# Patient Record
Sex: Female | Born: 1980 | Race: Black or African American | Hispanic: No | Marital: Single | Smoking: Former smoker
Health system: Southern US, Community
[De-identification: ages and names within clinical notes are randomized; demographics above are authoritative.]

## PROBLEM LIST (undated history)

## (undated) DIAGNOSIS — G809 Cerebral palsy, unspecified: Secondary | ICD-10-CM

## (undated) HISTORY — PX: LEG SURGERY: SHX1003

---

## 2011-11-13 DIAGNOSIS — G809 Cerebral palsy, unspecified: Secondary | ICD-10-CM | POA: Insufficient documentation

## 2012-08-01 DIAGNOSIS — Z113 Encounter for screening for infections with a predominantly sexual mode of transmission: Secondary | ICD-10-CM | POA: Insufficient documentation

## 2012-09-04 ENCOUNTER — Encounter (HOSPITAL_BASED_OUTPATIENT_CLINIC_OR_DEPARTMENT_OTHER): Payer: Self-pay

## 2012-09-04 ENCOUNTER — Emergency Department (HOSPITAL_BASED_OUTPATIENT_CLINIC_OR_DEPARTMENT_OTHER)
Admission: EM | Admit: 2012-09-04 | Discharge: 2012-09-04 | Disposition: A | Discharge status: Home / Self Care | Payer: No Typology Code available for payment source | Attending: Emergency Medicine | Admitting: Emergency Medicine

## 2012-09-04 ENCOUNTER — Emergency Department (HOSPITAL_BASED_OUTPATIENT_CLINIC_OR_DEPARTMENT_OTHER): Payer: No Typology Code available for payment source

## 2012-09-04 DIAGNOSIS — Z87891 Personal history of nicotine dependence: Secondary | ICD-10-CM | POA: Insufficient documentation

## 2012-09-04 DIAGNOSIS — Z8669 Personal history of other diseases of the nervous system and sense organs: Secondary | ICD-10-CM | POA: Insufficient documentation

## 2012-09-04 DIAGNOSIS — O209 Hemorrhage in early pregnancy, unspecified: Secondary | ICD-10-CM | POA: Insufficient documentation

## 2012-09-04 DIAGNOSIS — O9989 Other specified diseases and conditions complicating pregnancy, childbirth and the puerperium: Secondary | ICD-10-CM | POA: Insufficient documentation

## 2012-09-04 DIAGNOSIS — R109 Unspecified abdominal pain: Secondary | ICD-10-CM | POA: Insufficient documentation

## 2012-09-04 DIAGNOSIS — N949 Unspecified condition associated with female genital organs and menstrual cycle: Secondary | ICD-10-CM | POA: Insufficient documentation

## 2012-09-04 HISTORY — DX: Cerebral palsy, unspecified: G80.9

## 2012-09-04 LAB — BASIC METABOLIC PANEL
BUN: 9 mg/dL (ref 6–23)
Chloride: 104 mEq/L (ref 96–112)
GFR calc Af Amer: >90 mL/min (ref >90)
Potassium: 3.6 mEq/L (ref 3.5–5.1)
Sodium: 137 mEq/L (ref 135–145)

## 2012-09-04 LAB — WET PREP, GENITAL
Trich, Wet Prep: NONE SEEN
Yeast Wet Prep HPF POC: NONE SEEN

## 2012-09-04 LAB — URINE MICROSCOPIC-ADD ON

## 2012-09-04 LAB — CBC WITH DIFFERENTIAL/PLATELET
Basophils Absolute: 0 10*3/uL (ref 0.0–0.1)
Basophils Relative: 0 % (ref 0–1)
Hemoglobin: 12.4 g/dL (ref 12.0–15.0)
MCHC: 35.5 g/dL (ref 30.0–36.0)
Monocytes Relative: 9 % (ref 3–12)
Neutro Abs: 4.1 10*3/uL (ref 1.7–7.7)
Neutrophils Relative %: 48 % (ref 43–77)

## 2012-09-04 LAB — URINALYSIS, ROUTINE W REFLEX MICROSCOPIC
Bilirubin Urine: NEGATIVE
Glucose, UA: NEGATIVE mg/dL
Ketones, ur: NEGATIVE mg/dL
pH: 6 (ref 5.0–8.0)

## 2012-09-04 LAB — HCG, QUANTITATIVE, PREGNANCY: hCG, Beta Chain, Quant, S: 4085 m[IU]/mL — ABNORMAL HIGH (ref <5)

## 2012-09-04 MED ORDER — ONDANSETRON HCL 4 MG/2ML IJ SOLN
4.0000 mg | Freq: Once | INTRAMUSCULAR | Status: AC
  Administered 2012-09-04: 4 mg via INTRAVENOUS
  Filled 2012-09-04: qty 2

## 2012-09-04 MED ORDER — MORPHINE SULFATE 4 MG/ML IJ SOLN
4.0000 mg | Freq: Once | INTRAMUSCULAR | Status: AC
  Administered 2012-09-04: 4 mg via INTRAVENOUS
  Filled 2012-09-04: qty 1

## 2012-09-04 MED ORDER — METRONIDAZOLE 500 MG PO TABS: 500.0000 mg | ORAL_TABLET | Freq: Two times a day (BID) | ORAL | Status: DC

## 2012-09-04 MED ORDER — PROMETHAZINE HCL 25 MG PO TABS: 25.0000 mg | ORAL_TABLET | Freq: Four times a day (QID) | ORAL | Status: DC | PRN

## 2012-09-04 MED ORDER — OXYCODONE-ACETAMINOPHEN 5-325 MG PO TABS: 2.0000 | ORAL_TABLET | ORAL | Status: DC | PRN

## 2012-09-04 NOTE — ED Notes (Signed)
Pt reports she is [redacted] weeks gestation and developed vaginal bleeding today.

## 2012-09-04 NOTE — ED Provider Notes (Signed)
History    CSN: [id number] Arrival date & time 09/04/12  1409  First MD Initiated Contact with Patient 09/04/12 1416     Chief Complaint  Patient presents with  . Vaginal Bleeding   (Consider location/radiation/quality/duration/timing/severity/associated sxs/prior Treatment) HPI Comments: Patient is a 32 year old G3 P2 female who presents today with vaginal bleeding. She is [redacted] weeks pregnant. She is followed by [facility] physicians. She has had a positive pregnancy test, hCG Quant, OB ultrasound yesterday. This a gestational sac on the ultrasound, but no fetal heart rate. She has had cramping for the past week. She states the cramping is suprapubic and feels like period cramps. She rates it as an 8/10. This morning she began to bleed heavily. Her blood type is B+. Normal period was June 9. No fevers, chills, nausea, vomiting, abdominal pain, vaginal discharge other than blood, urinary symptoms including urinary frequency, urgency, dysuria.  The history is provided by the patient. No language interpreter was used.   Past Medical History  Diagnosis Date  . Cerebral palsy    Past Surgical History  Procedure Laterality Date  . Leg surgery     No family history on file. History  Substance Use Topics  . Smoking status: Former Games developer  . Smokeless tobacco: Not on file  . Alcohol Use: No   OB History   Grav Para Term Preterm Abortions TAB SAB Ect Mult Living   1              Review of Systems  Constitutional: Negative for fever and chills.  Respiratory: Negative for shortness of breath.   Gastrointestinal: Negative for nausea, vomiting and abdominal pain.  Genitourinary: Positive for vaginal bleeding, menstrual problem and pelvic pain. Negative for dysuria and urgency.  All other systems reviewed and are negative.    Allergies  Vicodin  Home Medications  No current outpatient prescriptions on file. BP 121/56  Pulse 98  Temp(Src) 98.9 F (37.2 C)  (Oral)  Resp 16  Ht 5' (1.524 m)  Wt 112 lb (50.803 kg)  BMI 21.87 kg/m2  SpO2 100% Physical Exam  Nursing note and vitals reviewed. Constitutional: She is oriented to person, place, and time. She appears well-developed and well-nourished. No distress.  HENT:  Head: Normocephalic and atraumatic.  Right Ear: External ear normal.  Left Ear: External ear normal.  Nose: Nose normal.  Mouth/Throat: Oropharynx is clear and moist.  Eyes: Conjunctivae are normal.  Neck: Normal range of motion.  Cardiovascular: Normal rate, regular rhythm and normal heart sounds.   Pulmonary/Chest: Effort normal and breath sounds normal. No stridor. No respiratory distress. She has no wheezes. She has no rales.  Abdominal: Soft. She exhibits no distension. There is tenderness (mild) in the suprapubic area.  Genitourinary: Pelvic exam was performed with patient supine. Cervix exhibits discharge (significant amount of bleeding from os). Cervix exhibits no motion tenderness and no friability. Right adnexum displays no mass, no tenderness and no fullness. Left adnexum displays no mass, no tenderness and no fullness. No erythema or tenderness around the vagina. There is a foreign body (piercing) around the vagina. No signs of injury around the vagina.  Piercing on clitoris   Musculoskeletal: Normal range of motion.  Neurological: She is alert and oriented to person, place, and time. She has normal strength.  Skin: Skin is warm and dry. She is not diaphoretic. No erythema.  Psychiatric: She has a normal mood and affect. Her behavior is normal.    ED  Course  Procedures (including critical care time) Labs Reviewed  WET PREP, GENITAL - Abnormal; Notable for the following:    Clue Cells Wet Prep HPF POC FEW (*)    WBC, Wet Prep HPF POC FEW (*)    All other components within normal limits  CBC WITH DIFFERENTIAL - Abnormal; Notable for the following:    HCT 34.9 (*)    All other components within normal limits  HCG,  QUANTITATIVE, PREGNANCY - Abnormal; Notable for the following:    hCG, Beta Chain, Quant, S 4085 (*)    All other components within normal limits  URINALYSIS, ROUTINE W REFLEX MICROSCOPIC - Abnormal; Notable for the following:    Hgb urine dipstick LARGE (*)    All other components within normal limits  PREGNANCY, URINE - Abnormal; Notable for the following:    Preg Test, Ur POSITIVE (*)    All other components within normal limits  GC/CHLAMYDIA PROBE AMP  BASIC METABOLIC PANEL  URINE MICROSCOPIC-ADD ON   US Ob Comp Less 14 Wks  09/04/2012   *RADIOLOGY REPORT*  Clinical Data: Vaginal bleeding.  OBSTETRIC <14 WK [place] AND TRANSVAGINAL OB US  Technique:  Both transabdominal and transvaginal ultrasound examinations were performed for complete evaluation of the gestation as well as the maternal uterus, adnexal regions, and pelvic cul-de-sac.  Transvaginal technique was performed to assess early pregnancy.  Comparison:  None.  Intrauterine gestational sac:  Single sac visualized in the lower uterine segment Yolk sac: Visualized but appears abnormal Embryo: Not visualized  MSD: 9 mm  5 w 5 d  Maternal uterus/adnexae: A small subchorionic hemorrhage noted.  A small left ovarian corpus luteum also demonstrated.  Ovaries otherwise normal in appearance. No evidence of adnexal mass or free fluid.  IMPRESSION: Single intrauterine gestational sac located in the lower uterine segment with small amount of subchorionic hemorrhage.  Findings are suspicious but not yet definitive for failed pregnancy.  Recommend follow-up US in 7-10 days for definitive diagnosis.  This recommendation follows SRU consensus guidelines: Diagnostic Criteria for Nonviable Pregnancy Early in the First Trimester.  Malva Limes Med 2013; 161:0960-45.   Original Report Authenticated By: Myles Rosenthal, M.D.   US Ob Transvaginal  09/04/2012   *RADIOLOGY REPORT*  Clinical Data: Vaginal bleeding.  OBSTETRIC <14 WK [place] AND TRANSVAGINAL OB US  Technique:   Both transabdominal and transvaginal ultrasound examinations were performed for complete evaluation of the gestation as well as the maternal uterus, adnexal regions, and pelvic cul-de-sac.  Transvaginal technique was performed to assess early pregnancy.  Comparison:  None.  Intrauterine gestational sac:  Single sac visualized in the lower uterine segment Yolk sac: Visualized but appears abnormal Embryo: Not visualized  MSD: 9 mm  5 w 5 d  Maternal uterus/adnexae: A small subchorionic hemorrhage noted.  A small left ovarian corpus luteum also demonstrated.  Ovaries otherwise normal in appearance. No evidence of adnexal mass or free fluid.  IMPRESSION: Single intrauterine gestational sac located in the lower uterine segment with small amount of subchorionic hemorrhage.  Findings are suspicious but not yet definitive for failed pregnancy.  Recommend follow-up US in 7-10 days for definitive diagnosis.  This recommendation follows SRU consensus guidelines: Diagnostic Criteria for Nonviable Pregnancy Early in the First Trimester.  Malva Limes Med 2013; 409:8119-14.   Original Report Authenticated By: Myles Rosenthal, M.D.   1. Vaginal bleeding in pregnancy, first trimester     MDM  Patient presents with vaginal bleeding in her first trimester. HCG  Sharene Butters is 89. OB ultrasound shows findings that are suspicious, but not yet definitive for failed pregnancy. Discussed this with the patient. Followup with your OB on Monday. If she can not get into her OB on Monday, go to [facility]. Discussed warning signs of anemia because she is losing a significant amount of blood vaginally. Return instructions given. Vital signs stable for discharge. Discussed case with Dr. Bernette Mayers who agrees with plan. Patient / Family / Caregiver informed of clinical course, understand medical decision-making process, and agree with plan.   Mora Bellman, PA-C 09/04/12 1801

## 2012-09-06 NOTE — ED Provider Notes (Signed)
Medical screening examination/treatment/procedure(s) were performed by non-physician practitioner and as supervising physician I was immediately available for consultation/collaboration.   Rishika Mccollom B. Ahmadou Bolz, MD 09/06/12 1946 

## 2012-11-17 ENCOUNTER — Emergency Department (HOSPITAL_BASED_OUTPATIENT_CLINIC_OR_DEPARTMENT_OTHER)
Admission: EM | Admit: 2012-11-17 | Discharge: 2012-11-17 | Disposition: A | Discharge status: Home / Self Care | Payer: No Typology Code available for payment source | Attending: Emergency Medicine | Admitting: Emergency Medicine

## 2012-11-17 ENCOUNTER — Encounter (HOSPITAL_BASED_OUTPATIENT_CLINIC_OR_DEPARTMENT_OTHER): Payer: Self-pay | Admitting: *Deleted

## 2012-11-17 DIAGNOSIS — O210 Mild hyperemesis gravidarum: Secondary | ICD-10-CM | POA: Insufficient documentation

## 2012-11-17 DIAGNOSIS — O219 Vomiting of pregnancy, unspecified: Secondary | ICD-10-CM

## 2012-11-17 DIAGNOSIS — Z79899 Other long term (current) drug therapy: Secondary | ICD-10-CM | POA: Insufficient documentation

## 2012-11-17 DIAGNOSIS — R5381 Other malaise: Secondary | ICD-10-CM | POA: Insufficient documentation

## 2012-11-17 DIAGNOSIS — R51 Headache: Secondary | ICD-10-CM | POA: Insufficient documentation

## 2012-11-17 DIAGNOSIS — Z87891 Personal history of nicotine dependence: Secondary | ICD-10-CM | POA: Insufficient documentation

## 2012-11-17 DIAGNOSIS — N9489 Other specified conditions associated with female genital organs and menstrual cycle: Secondary | ICD-10-CM | POA: Insufficient documentation

## 2012-11-17 DIAGNOSIS — Z8669 Personal history of other diseases of the nervous system and sense organs: Secondary | ICD-10-CM | POA: Insufficient documentation

## 2012-11-17 DIAGNOSIS — O9989 Other specified diseases and conditions complicating pregnancy, childbirth and the puerperium: Secondary | ICD-10-CM | POA: Insufficient documentation

## 2012-11-17 LAB — PREGNANCY, URINE: Preg Test, Ur: POSITIVE — AB

## 2012-11-17 LAB — URINALYSIS, ROUTINE W REFLEX MICROSCOPIC
Bilirubin Urine: NEGATIVE
Nitrite: NEGATIVE
Protein, ur: NEGATIVE mg/dL
Specific Gravity, Urine: 1.008 (ref 1.005–1.030)
Urobilinogen, UA: 1 mg/dL (ref 0.0–1.0)

## 2012-11-17 MED ORDER — ONDANSETRON HCL 4 MG/2ML IJ SOLN: 4.0000 mg | Freq: Once | INTRAMUSCULAR | Status: DC

## 2012-11-17 MED ORDER — ONDANSETRON 8 MG PO TBDP: 8.0000 mg | ORAL_TABLET | Freq: Three times a day (TID) | ORAL | Status: DC | PRN

## 2012-11-17 MED ORDER — ONDANSETRON 4 MG PO TBDP
4.0000 mg | ORAL_TABLET | Freq: Once | ORAL | Status: AC
  Administered 2012-11-17: 4 mg via ORAL

## 2012-11-17 MED ORDER — PRENATABS FA PO TABS: 1.0000 | ORAL_TABLET | Freq: Every day | ORAL | Status: DC

## 2012-11-17 MED ORDER — ONDANSETRON 4 MG PO TBDP
ORAL_TABLET | ORAL | Status: AC
  Filled 2012-11-17: qty 1

## 2012-11-17 NOTE — ED Notes (Signed)
MD at bedside. 

## 2012-11-17 NOTE — ED Provider Notes (Signed)
CSN: [id number]     Arrival date & time 11/17/12  0049 History   None    Chief Complaint  Patient presents with  . Vomiting    (Consider location/radiation/quality/duration/timing/severity/associated sxs/prior Treatment) HPI This is a 32 year old female who had a miscarriage earlier this year. Her last menstrual period was 10/17/2012. She is here with a three-day history of general malaise, nausea and headache. She vomited one time before coming to the ED. She continues to be nauseated and is having some mild uterine cramping. She denies fever, chills, diarrhea, dysuria, hematuria, vaginal bleeding or vaginal discharge.  Past Medical History  Diagnosis Date  . Cerebral palsy    Past Surgical History  Procedure Laterality Date  . Leg surgery     History reviewed. No pertinent family history. History  Substance Use Topics  . Smoking status: Former Games developer  . Smokeless tobacco: Not on file  . Alcohol Use: No   OB History   Grav Para Term Preterm Abortions TAB SAB Ect Mult Living   1              Review of Systems  All other systems reviewed and are negative.    Allergies  Vicodin  Home Medications   Current Outpatient Rx  Name  Route  Sig  Dispense  Refill  . metroNIDAZOLE (FLAGYL) 500 MG tablet   Oral   Take 1 tablet (500 mg total) by mouth 2 (two) times daily. One po bid x 7 days   14 tablet   0   . oxyCODONE-acetaminophen (PERCOCET/ROXICET) 5-325 MG per tablet   Oral   Take 2 tablets by mouth every 4 (four) hours as needed for pain.   6 tablet   0   . promethazine (PHENERGAN) 25 MG tablet   Oral   Take 1 tablet (25 mg total) by mouth every 6 (six) hours as needed for nausea.   12 tablet   0    BP 126/66  Pulse 100  Temp(Src) 98.2 F (36.8 C) (Oral)  Resp 16  Ht 5' (1.524 m)  Wt 118 lb (53.524 kg)  BMI 23.05 kg/m2  SpO2 100%  LMP 10/17/2012  Breastfeeding? Unknown  Physical Exam General: Well-developed, well-nourished female in no acute  distress; appearance consistent with age of record HENT: normocephalic; atraumatic Eyes: pupils equal, round and reactive to light; extraocular muscles intact Neck: supple Heart: regular rate and rhythm Lungs: clear to auscultation bilaterally Abdomen: soft; nondistended; nontender; no masses or hepatosplenomegaly; bowel sounds present GU: No CVA tenderness Extremities: No deformity; full range of motion; pulses normal Neurologic: Awake, alert and oriented; motor function intact in all extremities and symmetric; no facial droop Skin: Warm and dry Psychiatric: Normal mood and affect    ED Course  Procedures (including critical care time)   MDM   Nursing notes and vitals signs, including pulse oximetry, reviewed.  Summary of this visit's results, reviewed by myself:  Labs:  Results for orders placed during the hospital encounter of 11/17/12 (from the past 24 hour(s))  URINALYSIS, ROUTINE W REFLEX MICROSCOPIC     Status: Abnormal   Collection Time    11/17/12 12:58 AM      Result Value Range   Color, Urine YELLOW  YELLOW   APPearance CLEAR  CLEAR   Specific Gravity, Urine 1.008  1.005 - 1.030   pH 6.0  5.0 - 8.0   Glucose, UA NEGATIVE  NEGATIVE mg/dL   Hgb urine dipstick NEGATIVE  NEGATIVE   Bilirubin  Urine NEGATIVE  NEGATIVE   Ketones, ur 15 (*) NEGATIVE mg/dL   Protein, ur NEGATIVE  NEGATIVE mg/dL   Urobilinogen, UA 1.0  0.0 - 1.0 mg/dL   Nitrite NEGATIVE  NEGATIVE   Leukocytes, UA NEGATIVE  NEGATIVE  PREGNANCY, URINE     Status: Abnormal   Collection Time    11/17/12 12:58 AM      Result Value Range   Preg Test, Ur POSITIVE (*) NEGATIVE   1:33 AM Vomiting is likely pregnancy related. We will treat her with Zofran and refer her to [facility].     Hanley Seamen, MD 11/17/12 [phone]

## 2012-11-17 NOTE — ED Notes (Signed)
Pt vomited x 1 tonight, denies fever or chills

## 2012-12-07 ENCOUNTER — Emergency Department (HOSPITAL_BASED_OUTPATIENT_CLINIC_OR_DEPARTMENT_OTHER)
Admission: EM | Admit: 2012-12-07 | Discharge: 2012-12-08 | Disposition: A | Discharge status: Home / Self Care | Payer: No Typology Code available for payment source | Attending: Emergency Medicine | Admitting: Emergency Medicine

## 2012-12-07 ENCOUNTER — Encounter (HOSPITAL_BASED_OUTPATIENT_CLINIC_OR_DEPARTMENT_OTHER): Payer: Self-pay | Admitting: Emergency Medicine

## 2012-12-07 ENCOUNTER — Emergency Department (HOSPITAL_BASED_OUTPATIENT_CLINIC_OR_DEPARTMENT_OTHER): Payer: No Typology Code available for payment source

## 2012-12-07 DIAGNOSIS — O9989 Other specified diseases and conditions complicating pregnancy, childbirth and the puerperium: Secondary | ICD-10-CM | POA: Insufficient documentation

## 2012-12-07 DIAGNOSIS — K5900 Constipation, unspecified: Secondary | ICD-10-CM | POA: Insufficient documentation

## 2012-12-07 DIAGNOSIS — Z79899 Other long term (current) drug therapy: Secondary | ICD-10-CM | POA: Insufficient documentation

## 2012-12-07 DIAGNOSIS — Z8669 Personal history of other diseases of the nervous system and sense organs: Secondary | ICD-10-CM | POA: Insufficient documentation

## 2012-12-07 DIAGNOSIS — O219 Vomiting of pregnancy, unspecified: Secondary | ICD-10-CM

## 2012-12-07 DIAGNOSIS — R05 Cough: Secondary | ICD-10-CM | POA: Insufficient documentation

## 2012-12-07 DIAGNOSIS — O26899 Other specified pregnancy related conditions, unspecified trimester: Secondary | ICD-10-CM

## 2012-12-07 DIAGNOSIS — Z3490 Encounter for supervision of normal pregnancy, unspecified, unspecified trimester: Secondary | ICD-10-CM

## 2012-12-07 DIAGNOSIS — N949 Unspecified condition associated with female genital organs and menstrual cycle: Secondary | ICD-10-CM | POA: Insufficient documentation

## 2012-12-07 DIAGNOSIS — R5381 Other malaise: Secondary | ICD-10-CM | POA: Insufficient documentation

## 2012-12-07 DIAGNOSIS — R059 Cough, unspecified: Secondary | ICD-10-CM | POA: Insufficient documentation

## 2012-12-07 DIAGNOSIS — Z87891 Personal history of nicotine dependence: Secondary | ICD-10-CM | POA: Insufficient documentation

## 2012-12-07 DIAGNOSIS — N898 Other specified noninflammatory disorders of vagina: Secondary | ICD-10-CM | POA: Insufficient documentation

## 2012-12-07 DIAGNOSIS — O210 Mild hyperemesis gravidarum: Secondary | ICD-10-CM | POA: Insufficient documentation

## 2012-12-07 DIAGNOSIS — R6883 Chills (without fever): Secondary | ICD-10-CM | POA: Insufficient documentation

## 2012-12-07 LAB — URINALYSIS, ROUTINE W REFLEX MICROSCOPIC
Bilirubin Urine: NEGATIVE
Glucose, UA: NEGATIVE mg/dL
Ketones, ur: NEGATIVE mg/dL
Leukocytes, UA: NEGATIVE
Protein, ur: NEGATIVE mg/dL

## 2012-12-07 LAB — COMPREHENSIVE METABOLIC PANEL
Albumin: 3.5 g/dL (ref 3.5–5.2)
Alkaline Phosphatase: 43 U/L (ref 39–117)
BUN: 8 mg/dL (ref 6–23)
Chloride: 102 mEq/L (ref 96–112)
Creatinine, Ser: 0.7 mg/dL (ref 0.50–1.10)
GFR calc Af Amer: >90 mL/min (ref >90)
GFR calc non Af Amer: >90 mL/min (ref >90)
Glucose, Bld: 91 mg/dL (ref 70–99)
Potassium: 3.7 mEq/L (ref 3.5–5.1)
Total Bilirubin: 0.2 mg/dL — ABNORMAL LOW (ref 0.3–1.2)

## 2012-12-07 LAB — CBC
MCV: 89.7 fL (ref 78.0–100.0)
Platelets: 283 10*3/uL (ref 150–400)
RBC: 3.59 MIL/uL — ABNORMAL LOW (ref 3.87–5.11)
RDW: 12.6 % (ref 11.5–15.5)
WBC: 7.9 10*3/uL (ref 4.0–10.5)

## 2012-12-07 LAB — WET PREP, GENITAL
Trich, Wet Prep: NONE SEEN
Yeast Wet Prep HPF POC: NONE SEEN

## 2012-12-07 MED ORDER — SODIUM CHLORIDE 0.9 % IV BOLUS (SEPSIS)
1000.0000 mL | INTRAVENOUS | Status: AC
  Administered 2012-12-07: 1000 mL via INTRAVENOUS

## 2012-12-07 MED ORDER — ONDANSETRON HCL 4 MG/2ML IJ SOLN
4.0000 mg | Freq: Once | INTRAMUSCULAR | Status: AC
  Administered 2012-12-07: 4 mg via INTRAVENOUS
  Filled 2012-12-07: qty 2

## 2012-12-07 NOTE — ED Notes (Signed)
Provider at bedside

## 2012-12-07 NOTE — ED Notes (Signed)
Vomiting x 3 days. She is [redacted] weeks pregnant. Chills.

## 2012-12-07 NOTE — ED Notes (Signed)
Patient transported to ultrasound via stretcher per tech. 

## 2012-12-07 NOTE — ED Provider Notes (Signed)
CSN: [id number]     Arrival date & time 12/07/12  2019 History  This chart was scribed for Coral Ceo, PA, working with Shanna Cisco, MD by Blanchard Kelch, ED Scribe. This patient was seen in room MH10/MH10 and the patient's care was started at 10:15 PM.      Chief Complaint  Patient presents with  . Emesis During Pregnancy    The history is provided by the patient. No language interpreter was used.    HPI Comments: Normagene Harvie is a 32 y.o. female with a PMH of cerebral palsy who presents to the Emergency Department complaining of nausea and vomiting that began three days ago. She vomited four times today and her last episode of emesis was about 7 pm. She states the material she vomits is whatever she ate. No hematemesis.  She is currently eight weeks pregnant and has been having similar episodes of vomiting throughout the pregnancy this far.  Patient also has had abdominal pain for the past two days.  Abdominal pain is intermittent, sharp and located in the lower middle abdominal without radiation.  Nothing makes her pain better or worse.  She has not done anything to treat her symptoms.  She has an appointment on 12/17/12 to establish care for her pregnancy.  This is her fourth pregnancy. She also reports having white, chalky vaginal discharge.  No vaginal bleeding, sores or pain.  She also complains of associated baseline non-productive cough, constipation with last BM two days ago, chills, generalized weakness.  She denies fever, SOB, chest pain, diarrhea, dysuria, hematuria, or difficulty urinating.  Her last normal menstrual period was 10/09/12.    Past Medical History  Diagnosis Date  . Cerebral palsy    Past Surgical History  Procedure Laterality Date  . Leg surgery     No family history on file. History  Substance Use Topics  . Smoking status: Former Games developer  . Smokeless tobacco: Not on file  . Alcohol Use: No   OB History   Grav Para Term Preterm Abortions TAB SAB  Ect Mult Living   2              Review of Systems  Constitutional: Positive for chills. Negative for fever, diaphoresis, activity change, appetite change and fatigue.  HENT: Negative for congestion, mouth sores and rhinorrhea.   Eyes: Negative for photophobia and visual disturbance.  Respiratory: Negative for cough and shortness of breath.   Cardiovascular: Negative for chest pain and leg swelling.  Gastrointestinal: Positive for nausea, vomiting, abdominal pain and constipation. Negative for diarrhea, blood in stool, anal bleeding and rectal pain.  Genitourinary: Positive for vaginal discharge and pelvic pain. Negative for dysuria, hematuria, vaginal bleeding, difficulty urinating and vaginal pain.  Musculoskeletal: Negative for back pain, gait problem, joint swelling, myalgias, neck pain and neck stiffness.  Skin: Negative for color change and wound.  Neurological: Positive for weakness (generalized). Negative for dizziness, syncope, light-headedness, numbness and headaches.  Psychiatric/Behavioral: Negative for confusion.  All other systems reviewed and are negative.    Allergies  Vicodin  Home Medications   Current Outpatient Rx  Name  Route  Sig  Dispense  Refill  . metroNIDAZOLE (FLAGYL) 500 MG tablet   Oral   Take 1 tablet (500 mg total) by mouth 2 (two) times daily. One po bid x 7 days   14 tablet   0   . ondansetron (ZOFRAN ODT) 8 MG disintegrating tablet   Oral   Take 1 tablet (8  mg total) by mouth every 8 (eight) hours as needed for nausea.   10 tablet   0   . oxyCODONE-acetaminophen (PERCOCET/ROXICET) 5-325 MG per tablet   Oral   Take 2 tablets by mouth every 4 (four) hours as needed for pain.   6 tablet   0   . Prenatal Vit-Fe Fumarate-FA (PRENATABS FA) TABS   Oral   Take 1 tablet by mouth daily.   30 each   0   . promethazine (PHENERGAN) 25 MG tablet   Oral   Take 1 tablet (25 mg total) by mouth every 6 (six) hours as needed for nausea.   12  tablet   0    Triage Vitals: BP 114/47  Pulse 74  Temp(Src) 97.4 F (36.3 C) (Oral)  Resp 18  Wt 118 lb (53.524 kg)  BMI 23.05 kg/m2  SpO2 100%  LMP 10/09/2012  Filed Vitals:   12/07/12 2028 12/08/12 0027  BP: 114/47 116/54  Pulse: 74 80  Temp: 97.4 F (36.3 C) 98.5 F (36.9 C)  TempSrc: Oral Oral  Resp: 18 16  Weight: 118 lb (53.524 kg)   SpO2: 100% 99%    Physical Exam  Nursing note and vitals reviewed. Constitutional: She is oriented to person, place, and time. She appears well-developed and well-nourished. No distress.  HENT:  Head: Normocephalic and atraumatic.  Right Ear: External ear normal.  Left Ear: External ear normal.  Nose: Nose normal.  Mouth/Throat: Oropharynx is clear and moist. No oropharyngeal exudate.  Eyes: Conjunctivae and EOM are normal. Right eye exhibits no discharge. Left eye exhibits no discharge.  Neck: Neck supple. No tracheal deviation present.  Cardiovascular: Normal rate, regular rhythm, normal heart sounds and intact distal pulses.  Exam reveals no gallop and no friction rub.   No murmur heard. Dorsalis pedis pulses present and equal bilaterally  Pulmonary/Chest: Effort normal and breath sounds normal. No respiratory distress. She has no wheezes. She has no rales. She exhibits no tenderness.  Abdominal: Soft. Bowel sounds are normal. She exhibits no distension and no mass. There is tenderness. There is no rebound and no guarding.  Mild middle lower abdominal tenderness to palpation.  No RLQ or LLQ tenderness.    Musculoskeletal: Normal range of motion. She exhibits no edema and no tenderness.  No CVA or lumbar tenderness bilaterally.   Neurological: She is alert and oriented to person, place, and time.  Skin: Skin is warm and dry. She is not diaphoretic.  Psychiatric: She has a normal mood and affect. Her behavior is normal.    ED Course  Procedures (including critical care time)  DIAGNOSTIC STUDIES: Oxygen Saturation is 100% on  room air, normal by my interpretation.    COORDINATION OF CARE: 10:21 PM -Will order IV fluids, Zofran injection, OB Ultrasound, CMP, CBC, Urinalysis and Pregnancy test. Patient verbalizes understanding and agrees with treatment plan.    Labs Review Labs Reviewed  WET PREP, GENITAL - Abnormal; Notable for the following:    Clue Cells Wet Prep HPF POC FEW (*)    WBC, Wet Prep HPF POC FEW (*)    All other components within normal limits  URINALYSIS, ROUTINE W REFLEX MICROSCOPIC - Abnormal; Notable for the following:    APPearance CLOUDY (*)    All other components within normal limits  PREGNANCY, URINE - Abnormal; Notable for the following:    Preg Test, Ur POSITIVE (*)    All other components within normal limits  COMPREHENSIVE METABOLIC PANEL - Abnormal;  Notable for the following:    Total Bilirubin 0.2 (*)    All other components within normal limits  CBC - Abnormal; Notable for the following:    RBC 3.59 (*)    Hemoglobin 11.3 (*)    HCT 32.2 (*)    All other components within normal limits  GC/CHLAMYDIA PROBE AMP   Imaging Review US Ob Comp Less 14 Wks  12/08/2012   CLINICAL DATA:  Abdominal pain and pregnancy  EXAM: OBSTETRIC <14 WK ULTRASOUND  TECHNIQUE: Transabdominal ultrasound was performed for evaluation of the gestation as well as the maternal uterus and adnexal regions.  COMPARISON:  09/04/2012  FINDINGS: Intrauterine gestational sac: Visualized/normal in shape.  Yolk sac:  present  Embryo:  Present  Cardiac Activity: Present  Heart Rate: 158 bpm  CRL:   17  mm   8 w 1 d                  [place] EDC: 07/18/2013  Maternal uterus/adnexae: The uterus is unremarkable other than above described gestation. Right ovary measures 4.2 x 2.8 x 3.8 cm, larger than the left secondary to a dominant follicle or cyst, 2.8 cm in maximal dimension. The left ovary measures 2.7 x 2.8 x 2.7 cm.  IMPRESSION: Single living intrauterine gestation, estimated gestational age [redacted] weeks 1 day.    Electronically Signed   By: Tiburcio Pea M.D.   On: 12/08/2012 00:15    EKG Interpretation   None      Results for orders placed during the hospital encounter of 12/07/12  WET PREP, GENITAL      Result Value Range   Yeast Wet Prep HPF POC NONE SEEN  NONE SEEN   Trich, Wet Prep NONE SEEN  NONE SEEN   Clue Cells Wet Prep HPF POC FEW (*) NONE SEEN   WBC, Wet Prep HPF POC FEW (*) NONE SEEN  GC/CHLAMYDIA PROBE AMP      Result Value Range   CT Probe RNA NEGATIVE  NEGATIVE   GC Probe RNA NEGATIVE  NEGATIVE  URINALYSIS, ROUTINE W REFLEX MICROSCOPIC      Result Value Range   Color, Urine YELLOW  YELLOW   APPearance CLOUDY (*) CLEAR   Specific Gravity, Urine 1.018  1.005 - 1.030   pH 6.0  5.0 - 8.0   Glucose, UA NEGATIVE  NEGATIVE mg/dL   Hgb urine dipstick NEGATIVE  NEGATIVE   Bilirubin Urine NEGATIVE  NEGATIVE   Ketones, ur NEGATIVE  NEGATIVE mg/dL   Protein, ur NEGATIVE  NEGATIVE mg/dL   Urobilinogen, UA 0.2  0.0 - 1.0 mg/dL   Nitrite NEGATIVE  NEGATIVE   Leukocytes, UA NEGATIVE  NEGATIVE  PREGNANCY, URINE      Result Value Range   Preg Test, Ur POSITIVE (*) NEGATIVE  COMPREHENSIVE METABOLIC PANEL      Result Value Range   Sodium 135  135 - 145 mEq/L   Potassium 3.7  3.5 - 5.1 mEq/L   Chloride 102  96 - 112 mEq/L   CO2 22  19 - 32 mEq/L   Glucose, Bld 91  70 - 99 mg/dL   BUN 8  6 - 23 mg/dL   Creatinine, Ser 1.19  0.50 - 1.10 mg/dL   Calcium 9.0  8.4 - 14.7 mg/dL   Total Protein 6.6  6.0 - 8.3 g/dL   Albumin 3.5  3.5 - 5.2 g/dL   AST 18  0 - 37 U/L   ALT 12  0 -  35 U/L   Alkaline Phosphatase 43  39 - 117 U/L   Total Bilirubin 0.2 (*) 0.3 - 1.2 mg/dL   GFR calc non Af Amer >90  >90 mL/min   GFR calc Af Amer >90  >90 mL/min  CBC      Result Value Range   WBC 7.9  4.0 - 10.5 K/uL   RBC 3.59 (*) 3.87 - 5.11 MIL/uL   Hemoglobin 11.3 (*) 12.0 - 15.0 g/dL   HCT 30.8 (*) 65.7 - 84.6 %   MCV 89.7  78.0 - 100.0 fL   MCH 31.5  26.0 - 34.0 pg   MCHC 35.1  30.0 - 36.0  g/dL   RDW 96.2  95.2 - 84.1 %   Platelets 283  150 - 400 K/uL   US OB Comp Less 14 Wks (Final result)  Result time: 12/08/12 00:15:31    Final result by Rad Results In Interface (12/08/12 00:15:31)    Narrative:   CLINICAL DATA: Abdominal pain and pregnancy  EXAM: OBSTETRIC <14 WK ULTRASOUND  TECHNIQUE: Transabdominal ultrasound was performed for evaluation of the gestation as well as the maternal uterus and adnexal regions.  COMPARISON: 09/04/2012  FINDINGS: Intrauterine gestational sac: Visualized/normal in shape.  Yolk sac: present  Embryo: Present  Cardiac Activity: Present  Heart Rate: 158 bpm  CRL: 17 mm 8 w 1 d [place] EDC: 07/18/2013  Maternal uterus/adnexae: The uterus is unremarkable other than above described gestation. Right ovary measures 4.2 x 2.8 x 3.8 cm, larger than the left secondary to a dominant follicle or cyst, 2.8 cm in maximal dimension. The left ovary measures 2.7 x 2.8 x 2.7 cm.  IMPRESSION: Single living intrauterine gestation, estimated gestational age [redacted] weeks 1 day.   Electronically Signed By: Tiburcio Pea M.D. On: 12/08/2012 00:15         MDM   1. Pelvic pain in pregnancy   2. Intrauterine pregnancy   3. Nausea and vomiting in pregnancy     Samantha Sutton is a 33 y.o. female with a PMH of cerebral palsy who presents to the Emergency Department complaining of nausea and vomiting that began three days ago.  1L fluids ordered.  Zofran given for nausea. Pelvic US ordered to evaluate for ectopic.  CBC, CMP, UA, urine pregnancy ordered.  Will perform pelvic exam.     Rechecks  11:50 PM = Pelvic exam performed at bedside with nurse present. Minimal amount of thin white discharge present in the vaginal canal. No vaginal bleeding. No cervical motion tenderness. No adnexal tenderness bilaterally. 12:50 AM = Patient states she feels much better.  Abdominal pain resolved.  States "it was really more of a discomfort."  Tolerated PO  challenge. No emesis.  Repeat abdominal exam benign.     Patient was evaluated in the ED for nausea and emesis, which is likely due to hyperemesis gravidarum or normal pregnancy emesis. She had no episodes of emesis throughout her ED visit.  Her electrolytes were WNL and there were no significant signs of dehydration on UA.  She was prescribed Zofran for further evaluation and management.  Patient complained of middle lower pelvic pain on ROS. She had confirmation of an intrauterine gestation on ultrasound.  Patient's pain resolved throughout her emergency room visit. Her abdominal exam was benign.  She had a few clue cells, however, will not be treated at this time.  She was recently treated with Flagyl.  GC probe sent for analysis.  Urinalysis not suggestive of a urinary  tract infection at this time.  Patient was instructed to followup at her scheduled appointment with OB/GYN. Patient was instructed to return to the ED if they experience any signs of dehydration, fever, repeated vomiting, vaginal bleeding, change or worsening abdominal pain, or other concerns.  Patient was in agreement with discharge and plan.  Sister is present in the emergency room who is also a patient of mine.    Final impressions: 1. Pelvic pain in pregnancy  2. Intrauterine pregnancy  3. Nausea and vomiting in pregnancy     Luiz Iron PA-C   I personally performed the services described in this documentation, which was scribed in my presence. The recorded information has been reviewed and is accurate.     Jillyn Ledger, PA-C 12/10/12 [phone]

## 2012-12-08 MED ORDER — ONDANSETRON HCL 4 MG PO TABS: 4.0000 mg | ORAL_TABLET | Freq: Three times a day (TID) | ORAL | Status: DC | PRN

## 2012-12-08 NOTE — ED Notes (Signed)
Provider at bedside to discuss results of testing.  Pt tolerating po challenge without emesis.

## 2012-12-14 NOTE — ED Provider Notes (Signed)
Medical screening examination/treatment/procedure(s) were performed by non-physician practitioner and as supervising physician I was immediately available for consultation/collaboration.   Shanna Cisco, MD 12/14/12 [phone]

## 2013-01-04 DIAGNOSIS — R011 Cardiac murmur, unspecified: Secondary | ICD-10-CM | POA: Insufficient documentation

## 2013-10-04 ENCOUNTER — Ambulatory Visit (INDEPENDENT_AMBULATORY_CARE_PROVIDER_SITE_OTHER): Payer: Medicare (Managed Care) | Admitting: Family Medicine

## 2013-10-04 VITALS — BP 120/80 | HR 52 | Temp 97.9°F | Resp 16 | Ht 67.5 in | Wt 103.0 lb

## 2013-10-04 DIAGNOSIS — Z111 Encounter for screening for respiratory tuberculosis: Secondary | ICD-10-CM

## 2013-10-04 NOTE — Patient Instructions (Signed)
Good luck with your new job- thanks for waiting so patiently today.  Take care!

## 2013-10-04 NOTE — Progress Notes (Signed)
[facility] [address], [place] [id number] [phone]  Date:  10/04/2013   Name:  Samantha MendsLathia Ramos   DOB:  1980-10-23   MRN:  [id number]  PCP:  No PCP Per Patient    Chief Complaint: PPD Placement   History of Present Illness:  Samantha MendsLathia Lannan is a [age] y.o. 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  She has never tested positive- has had negiatvie PPD in the past.  She has a history of CP and is otherwise generally healthy.  She delivered a baby girl in May and has not yet returned to menstruation - her baby is doing well  There are no active problems to display for this patient.   Past Medical History  Diagnosis Date  . Cerebral palsy     Past Surgical History  Procedure Laterality Date  . Leg surgery      History  Substance Use Topics  . Smoking status: Former Games developermoker  . Smokeless tobacco: Not on file  . Alcohol Use: No    Family History  Problem Relation Age of Onset  . Diabetes Mother   . High Cholesterol Mother   . Hypertension Mother   . Diabetes Maternal Grandmother   . Hypertension Maternal Grandmother   . High Cholesterol Maternal Grandmother   . Diabetes Paternal Grandmother   . High Cholesterol Paternal Grandmother     Allergies  Allergen Reactions  . Vicodin [Hydrocodone-Acetaminophen] Itching    Medication list has been reviewed and updated.  Current Outpatient Prescriptions on File Prior to Visit  Medication Sig Dispense Refill  . ondansetron (ZOFRAN) 4 MG tablet Take 1 tablet (4 mg total) by mouth every 8 (eight) hours as needed for nausea.  10 tablet  0  . oxyCODONE-acetaminophen (PERCOCET/ROXICET) 5-325 MG per tablet Take 2 tablets by mouth every 4 (four) hours as needed for pain.  6 tablet  0   No current facility-administered medications on file prior to visit.    Review of Systems:  As per HPI- otherwise  negative.   Physical Examination: Filed Vitals:   10/04/13 1223  BP: 120/80  Pulse: 52  Temp: 97.9 F (36.6 C)  Resp: 16   Filed Vitals:   10/04/13 1223  Height: 5' 7.5" (1.715 m)  Weight: 103 lb (46.72 kg)   Body mass index is 15.88 kg/(m^2). Ideal Body Weight: Weight in (lb) to have BMI = 25: 161.7  GEN: WDWN, NAD, Non-toxic, A & O x 3, slim build HEENT: Atraumatic, Normocephalic. Neck supple. No masses, No LAD. Ears and Nose: No external deformity. CV: RRR, No M/G/R. No JVD. No thrill. No extra heart sounds. PULM: CTA B, no wheezes, crackles, rhonchi. No retractions. No resp. distress. No accessory muscle use. EXTR: No c/c/e NEURO Normal gait for pt- she has CP and spasticity of her muscles PSYCH: Normally interactive. Conversant. Not depressed or anxious appearing.  Calm demeanor.    Assessment and Plan: Screening-pulmonary TB - Plan: TB Skin Test  Placed PPD today- she will return for read  Signed Abbe AmsterdamJessica Copland, MD

## 2013-10-06 ENCOUNTER — Ambulatory Visit (INDEPENDENT_AMBULATORY_CARE_PROVIDER_SITE_OTHER): Payer: Medicare (Managed Care) | Admitting: *Deleted

## 2013-10-06 DIAGNOSIS — Z111 Encounter for screening for respiratory tuberculosis: Secondary | ICD-10-CM

## 2013-10-06 DIAGNOSIS — Z09 Encounter for follow-up examination after completed treatment for conditions other than malignant neoplasm: Secondary | ICD-10-CM

## 2013-10-06 LAB — TB SKIN TEST
Induration: 0 mm
TB SKIN TEST: NEGATIVE

## 2014-06-28 ENCOUNTER — Encounter (HOSPITAL_BASED_OUTPATIENT_CLINIC_OR_DEPARTMENT_OTHER): Payer: Self-pay

## 2014-06-28 ENCOUNTER — Emergency Department (HOSPITAL_BASED_OUTPATIENT_CLINIC_OR_DEPARTMENT_OTHER): Payer: Medicare Other

## 2014-06-28 ENCOUNTER — Emergency Department (HOSPITAL_BASED_OUTPATIENT_CLINIC_OR_DEPARTMENT_OTHER)
Admission: EM | Admit: 2014-06-28 | Discharge: 2014-06-28 | Disposition: A | Discharge status: Home / Self Care | Payer: Medicare Other | Attending: Emergency Medicine | Admitting: Emergency Medicine

## 2014-06-28 DIAGNOSIS — S79911A Unspecified injury of right hip, initial encounter: Secondary | ICD-10-CM | POA: Insufficient documentation

## 2014-06-28 DIAGNOSIS — Y998 Other external cause status: Secondary | ICD-10-CM | POA: Insufficient documentation

## 2014-06-28 DIAGNOSIS — S8991XA Unspecified injury of right lower leg, initial encounter: Secondary | ICD-10-CM | POA: Insufficient documentation

## 2014-06-28 DIAGNOSIS — Y9389 Activity, other specified: Secondary | ICD-10-CM | POA: Insufficient documentation

## 2014-06-28 DIAGNOSIS — T07XXXA Unspecified multiple injuries, initial encounter: Secondary | ICD-10-CM

## 2014-06-28 DIAGNOSIS — Z8669 Personal history of other diseases of the nervous system and sense organs: Secondary | ICD-10-CM | POA: Diagnosis not present

## 2014-06-28 DIAGNOSIS — Z3202 Encounter for pregnancy test, result negative: Secondary | ICD-10-CM | POA: Insufficient documentation

## 2014-06-28 DIAGNOSIS — S8992XA Unspecified injury of left lower leg, initial encounter: Secondary | ICD-10-CM | POA: Insufficient documentation

## 2014-06-28 DIAGNOSIS — S5011XA Contusion of right forearm, initial encounter: Secondary | ICD-10-CM | POA: Diagnosis not present

## 2014-06-28 DIAGNOSIS — Y92410 Unspecified street and highway as the place of occurrence of the external cause: Secondary | ICD-10-CM | POA: Insufficient documentation

## 2014-06-28 DIAGNOSIS — Z87891 Personal history of nicotine dependence: Secondary | ICD-10-CM | POA: Diagnosis not present

## 2014-06-28 LAB — PREGNANCY, URINE: PREG TEST UR: NEGATIVE

## 2014-06-28 MED ORDER — IBUPROFEN 800 MG PO TABS
800.0000 mg | ORAL_TABLET | Freq: Once | ORAL | Status: AC
  Administered 2014-06-28: 800 mg via ORAL
  Filled 2014-06-28: qty 1

## 2014-06-28 MED ORDER — TRAMADOL HCL 50 MG PO TABS: 50.0000 mg | ORAL_TABLET | Freq: Four times a day (QID) | ORAL | Status: DC | PRN

## 2014-06-28 MED ORDER — IBUPROFEN 600 MG PO TABS: 600.0000 mg | ORAL_TABLET | Freq: Four times a day (QID) | ORAL | Status: DC | PRN

## 2014-06-28 NOTE — Discharge Instructions (Signed)
Contusion °A contusion is a deep bruise. Contusions are the result of an injury that caused bleeding under the skin. The contusion may turn blue, purple, or yellow. Minor injuries will give you a painless contusion, but more severe contusions may stay painful and swollen for a few weeks.  °CAUSES  °A contusion is usually caused by a blow, trauma, or direct force to an area of the body. °SYMPTOMS  °· Swelling and redness of the injured area. °· Bruising of the injured area. °· Tenderness and soreness of the injured area. °· Pain. °DIAGNOSIS  °The diagnosis can be made by taking a history and physical exam. An X-ray, CT scan, or MRI may be needed to determine if there were any associated injuries, such as fractures. °TREATMENT  °Specific treatment will depend on what area of the body was injured. In general, the best treatment for a contusion is resting, icing, elevating, and applying cold compresses to the injured area. Over-the-counter medicines may also be recommended for pain control. Ask your caregiver what the best treatment is for your contusion. °HOME CARE INSTRUCTIONS  °· Put ice on the injured area. °¨ Put ice in a plastic bag. °¨ Place a towel between your skin and the bag. °¨ Leave the ice on for 15-20 minutes, 3-4 times a day, or as directed by your health care provider. °· Only take over-the-counter or prescription medicines for pain, discomfort, or fever as directed by your caregiver. Your caregiver may recommend avoiding anti-inflammatory medicines (aspirin, ibuprofen, and naproxen) for 48 hours because these medicines may increase bruising. °· Rest the injured area. °· If possible, elevate the injured area to reduce swelling. °SEEK IMMEDIATE MEDICAL CARE IF:  °· You have increased bruising or swelling. °· You have pain that is getting worse. °· Your swelling or pain is not relieved with medicines. °MAKE SURE YOU:  °· Understand these instructions. °· Will watch your condition. °· Will get help right  away if you are not doing well or get worse. °Document Released: 11/21/2004 Document Revised: 02/16/2013 Document Reviewed: 12/17/2010 °[employer]® Patient Information ©2015 [employer]. This information is not intended to replace advice given to you by your health care provider. Make sure you discuss any questions you have with your health care provider. ° °
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
## 2014-06-28 NOTE — ED Notes (Signed)
MD at bedside. 

## 2018-01-20 ENCOUNTER — Telehealth: Payer: Self-pay | Admitting: Neurology

## 2018-01-20 ENCOUNTER — Encounter: Payer: Medicare Other | Admitting: Neurology

## 2018-01-20 NOTE — Telephone Encounter (Signed)
This patient canceled the same day of an EMG and nerve conduction study evaluation. 

## 2018-01-27 ENCOUNTER — Encounter: Payer: Self-pay | Admitting: Neurology

## 2018-02-15 ENCOUNTER — Emergency Department (HOSPITAL_BASED_OUTPATIENT_CLINIC_OR_DEPARTMENT_OTHER): Payer: Medicare Other

## 2018-02-15 ENCOUNTER — Encounter (HOSPITAL_BASED_OUTPATIENT_CLINIC_OR_DEPARTMENT_OTHER): Payer: Self-pay | Admitting: Emergency Medicine

## 2018-02-15 ENCOUNTER — Emergency Department (HOSPITAL_BASED_OUTPATIENT_CLINIC_OR_DEPARTMENT_OTHER)
Admission: EM | Admit: 2018-02-15 | Discharge: 2018-02-15 | Disposition: A | Discharge status: Home / Self Care | Payer: Medicare Other | Attending: Emergency Medicine | Admitting: Emergency Medicine

## 2018-02-15 ENCOUNTER — Other Ambulatory Visit: Payer: Self-pay

## 2018-02-15 DIAGNOSIS — R52 Pain, unspecified: Secondary | ICD-10-CM

## 2018-02-15 DIAGNOSIS — M7918 Myalgia, other site: Secondary | ICD-10-CM | POA: Diagnosis not present

## 2018-02-15 DIAGNOSIS — R51 Headache: Secondary | ICD-10-CM | POA: Insufficient documentation

## 2018-02-15 DIAGNOSIS — Z3A01 Less than 8 weeks gestation of pregnancy: Secondary | ICD-10-CM | POA: Insufficient documentation

## 2018-02-15 DIAGNOSIS — R10819 Abdominal tenderness, unspecified site: Secondary | ICD-10-CM | POA: Insufficient documentation

## 2018-02-15 DIAGNOSIS — G809 Cerebral palsy, unspecified: Secondary | ICD-10-CM | POA: Diagnosis not present

## 2018-02-15 DIAGNOSIS — Z87891 Personal history of nicotine dependence: Secondary | ICD-10-CM | POA: Diagnosis not present

## 2018-02-15 DIAGNOSIS — O99351 Diseases of the nervous system complicating pregnancy, first trimester: Secondary | ICD-10-CM | POA: Diagnosis not present

## 2018-02-15 DIAGNOSIS — O9989 Other specified diseases and conditions complicating pregnancy, childbirth and the puerperium: Secondary | ICD-10-CM | POA: Diagnosis not present

## 2018-02-15 LAB — CBC WITH DIFFERENTIAL/PLATELET
Abs Immature Granulocytes: 0.01 10*3/uL (ref 0.00–0.07)
Basophils Absolute: 0 10*3/uL (ref 0.0–0.1)
Basophils Relative: 0 %
Eosinophils Absolute: 0.1 10*3/uL (ref 0.0–0.5)
Eosinophils Relative: 1 %
HCT: 38.1 % (ref 36.0–46.0)
Hemoglobin: 12.4 g/dL (ref 12.0–15.0)
Immature Granulocytes: 0 %
Lymphocytes Relative: 55 %
Lymphs Abs: 4.2 10*3/uL — ABNORMAL HIGH (ref 0.7–4.0)
MCH: 30.1 pg (ref 26.0–34.0)
MCHC: 32.5 g/dL (ref 30.0–36.0)
MCV: 92.5 fL (ref 80.0–100.0)
Monocytes Absolute: 0.7 10*3/uL (ref 0.1–1.0)
Monocytes Relative: 9 %
Neutro Abs: 2.7 10*3/uL (ref 1.7–7.7)
Neutrophils Relative %: 35 %
Platelets: 322 10*3/uL (ref 150–400)
RBC: 4.12 MIL/uL (ref 3.87–5.11)
RDW: 13.2 % (ref 11.5–15.5)
WBC: 7.6 10*3/uL (ref 4.0–10.5)
nRBC: 0 % (ref 0.0–0.2)

## 2018-02-15 LAB — BASIC METABOLIC PANEL
Anion gap: 5 (ref 5–15)
BUN: 10 mg/dL (ref 6–20)
CO2: 23 mmol/L (ref 22–32)
Calcium: 8.8 mg/dL — ABNORMAL LOW (ref 8.9–10.3)
Chloride: 106 mmol/L (ref 98–111)
Creatinine, Ser: 0.62 mg/dL (ref 0.44–1.00)
GFR calc Af Amer: >60 mL/min (ref >60)
GFR calc non Af Amer: >60 mL/min (ref >60)
Glucose, Bld: 86 mg/dL (ref 70–99)
Potassium: 3.2 mmol/L — ABNORMAL LOW (ref 3.5–5.1)
Sodium: 134 mmol/L — ABNORMAL LOW (ref 135–145)

## 2018-02-15 LAB — URINALYSIS, ROUTINE W REFLEX MICROSCOPIC
Bilirubin Urine: NEGATIVE
Glucose, UA: NEGATIVE mg/dL
Hgb urine dipstick: NEGATIVE
Ketones, ur: NEGATIVE mg/dL
Leukocytes, UA: NEGATIVE
Nitrite: NEGATIVE
Protein, ur: NEGATIVE mg/dL
Specific Gravity, Urine: 1.02 (ref 1.005–1.030)
pH: 7 (ref 5.0–8.0)

## 2018-02-15 LAB — CK: Total CK: 92 U/L (ref 38–234)

## 2018-02-15 LAB — PREGNANCY, URINE: Preg Test, Ur: POSITIVE — AB

## 2018-02-15 LAB — HCG, QUANTITATIVE, PREGNANCY: hCG, Beta Chain, Quant, S: 5116 m[IU]/mL — ABNORMAL HIGH (ref <5)

## 2018-02-15 MED ORDER — SODIUM CHLORIDE 0.9 % IV BOLUS
1000.0000 mL | Freq: Once | INTRAVENOUS | Status: AC
Start: 2018-02-15 — End: 2018-02-15
  Administered 2018-02-15: 1000 mL via INTRAVENOUS

## 2018-02-15 NOTE — ED Triage Notes (Signed)
Reports generalized body aches x 3-4 days with fever.

## 2018-02-15 NOTE — ED Provider Notes (Signed)
[facility] EMERGENCY DEPARTMENT Provider Note   CSN: [id number] Arrival date & time: 02/15/18  1511     History   Chief Complaint Chief Complaint  Patient presents with  . Generalized Body Aches    HPI Samantha Sutton is a 37 y.o. female.  HPI Patient presents to the emergency department with neurolysed body aches chills and headache.  The patient states that he started 3 to 4 days ago.  She does have some mild nausea occasionally.  The patient states that nothing seems to make her condition better or worse.  The patient denies chest pain, shortness of breath, headache,blurred vision, neck pain, fever, cough, weakness, numbness, dizziness, anorexia, edema,  vomiting, diarrhea, rash, back pain, dysuria, hematemesis, bloody stool, near syncope, or syncope.  Patient does have some mild lower abdominal discomfort. Past Medical History:  Diagnosis Date  . Cerebral palsy (HCC)     There are no active problems to display for this patient.   Past Surgical History:  Procedure Laterality Date  . LEG SURGERY       OB History    Gravida  3   Para      Term      Preterm      AB      Living        SAB      TAB      Ectopic      Multiple      Live Births               Home Medications    Prior to Admission medications   Medication Sig Start Date End Date Taking? Authorizing Provider  ibuprofen (ADVIL,MOTRIN) 600 MG tablet Take 1 tablet (600 mg total) by mouth every 6 (six) hours as needed. 06/28/14   Rolan Bucco, MD  traMADol (ULTRAM) 50 MG tablet Take 1 tablet (50 mg total) by mouth every 6 (six) hours as needed. 06/28/14   Rolan Bucco, MD    Family History Family History  Problem Relation Age of Onset  . Diabetes Mother   . High Cholesterol Mother   . Hypertension Mother   . Diabetes Maternal Grandmother   . Hypertension Maternal Grandmother   . High Cholesterol Maternal Grandmother   . Diabetes Paternal Grandmother   . High  Cholesterol Paternal Grandmother     Social History Social History   Tobacco Use  . Smoking status: Former Games developer  . Smokeless tobacco: Never Used  Substance Use Topics  . Alcohol use: No  . Drug use: Yes    Frequency: 3.0 times per week    Types: Marijuana     Allergies   Vicodin [hydrocodone-acetaminophen]   Review of Systems Review of Systems All other systems negative except as documented in the HPI. All pertinent positives and negatives as reviewed in the HPI.  Physical Exam Updated Vital Signs BP 132/80 (BP Location: Right Arm)   Pulse 81   Temp 97.9 F (36.6 C) (Oral)   Resp 16   Ht 5' (1.524 m)   Wt 49.9 kg   LMP 01/22/2018 (Approximate)   SpO2 100%   BMI 21.48 kg/m   Physical Exam Vitals signs and nursing note reviewed.  Constitutional:      General: She is not in acute distress.    Appearance: She is well-developed.  HENT:     Head: Normocephalic and atraumatic.  Eyes:     Pupils: Pupils are equal, round, and reactive to light.  Neck:  Musculoskeletal: Normal range of motion and neck supple.  Cardiovascular:     Rate and Rhythm: Normal rate and regular rhythm.     Heart sounds: Normal heart sounds. No murmur. No friction rub. No gallop.   Pulmonary:     Effort: Pulmonary effort is normal. No respiratory distress.     Breath sounds: Normal breath sounds. No wheezing.  Abdominal:     General: Bowel sounds are normal. There is no distension.     Palpations: Abdomen is soft. There is no mass.     Tenderness: There is abdominal tenderness. There is no right CVA tenderness, left CVA tenderness, guarding or rebound.  Skin:    General: Skin is warm and dry.     Capillary Refill: Capillary refill takes less than 2 seconds.     Findings: No erythema or rash.  Neurological:     Mental Status: She is alert and oriented to person, place, and time.     Motor: No abnormal muscle tone.     Coordination: Coordination normal.  Psychiatric:         Behavior: Behavior normal.      ED Treatments / Results  Labs (all labs ordered are listed, but only abnormal results are displayed) Labs Reviewed  CBC WITH DIFFERENTIAL/PLATELET - Abnormal; Notable for the following components:      Result Value   Lymphs Abs 4.2 (*)    All other components within normal limits  BASIC METABOLIC PANEL - Abnormal; Notable for the following components:   Sodium 134 (*)    Potassium 3.2 (*)    Calcium 8.8 (*)    All other components within normal limits  PREGNANCY, URINE - Abnormal; Notable for the following components:   Preg Test, Ur POSITIVE (*)    All other components within normal limits  HCG, QUANTITATIVE, PREGNANCY - Abnormal; Notable for the following components:   hCG, Beta Chain, Quant, S 5,116 (*)    All other components within normal limits  URINALYSIS, ROUTINE W REFLEX MICROSCOPIC  CK    EKG None  Radiology [place] Ob Comp Less 14 Wks  Result Date: 02/15/2018 CLINICAL DATA:  Nausea.  Positive pregnancy test. EXAM: OBSTETRIC <14 WK US AND TRANSVAGINAL OB US TECHNIQUE: Both transabdominal and transvaginal ultrasound examinations were performed for complete evaluation of the gestation as well as the maternal uterus, adnexal regions, and pelvic cul-de-sac. Transvaginal technique was performed to assess early pregnancy. COMPARISON:  None. FINDINGS: Intrauterine gestational sac: Single Yolk sac:  Visualized Embryo:  Not visualized Cardiac Activity: N/A MSD: 5.7 mm   5 w   2 d Subchorionic hemorrhage:  Small to moderate Maternal uterus/adnexae: Unremarkable ovaries. Trace free fluid noted in the cul-de-sac. IMPRESSION: Single intrauterine gestational sac with visible yolk sac but no embryo evident. Mean sac diameter estimates 5 week 2 day gestational age. Electronically Signed   By: Kennith CenterEric  Mansell M.D.   On: 02/15/2018 19:24   [place] Ob Transvaginal  Result Date: 02/15/2018 CLINICAL DATA:  Nausea.  Positive pregnancy test. EXAM: OBSTETRIC <14 WK US  AND TRANSVAGINAL OB US TECHNIQUE: Both transabdominal and transvaginal ultrasound examinations were performed for complete evaluation of the gestation as well as the maternal uterus, adnexal regions, and pelvic cul-de-sac. Transvaginal technique was performed to assess early pregnancy. COMPARISON:  None. FINDINGS: Intrauterine gestational sac: Single Yolk sac:  Visualized Embryo:  Not visualized Cardiac Activity: N/A MSD: 5.7 mm   5 w   2 d Subchorionic hemorrhage:  Small to moderate Maternal uterus/adnexae:  Unremarkable ovaries. Trace free fluid noted in the cul-de-sac. IMPRESSION: Single intrauterine gestational sac with visible yolk sac but no embryo evident. Mean sac diameter estimates 5 week 2 day gestational age. Electronically Signed   By: Kennith CenterEric  Mansell M.D.   On: 02/15/2018 19:24    Procedures Procedures (including critical care time)  Medications Ordered in ED Medications  sodium chloride 0.9 % bolus 1,000 mL (0 mLs Intravenous Stopped 02/15/18 1838)     Initial Impression / Assessment and Plan / ED Course  I have reviewed the triage vital signs and the nursing notes.  Pertinent labs & imaging results that were available during my care of the patient were reviewed by me and considered in my medical decision making (see chart for details).     Patient was given IV fluids here in the emergency department she is pregnant and I feel that is the cause of most of her symptoms.  She does not have any upper respiratory symptoms.  Patient's ultrasound does not show any significant abnormality at this time.  Patient will be advised to follow-up with her GYN doctor.  Final Clinical Impressions(s) / ED Diagnoses   Final diagnoses:  None    ED Discharge Orders    None       Kyra MangesLawyer, Mackayla Mullins, PA-C 02/15/18 2009    Sabas SousBero, Michael M, MD 02/16/18 0020

## 2018-02-15 NOTE — Discharge Instructions (Addendum)
Return here as needed.  Take Tylenol for any discomfort.  Increase your fluid intake.  Follow-up with your GYN doctor or the clinic provided

## 2018-02-15 NOTE — ED Notes (Signed)
Patient transported to Ultrasound 

## 2018-02-15 NOTE — ED Notes (Signed)
Pt reports 3-4 days of subjective fever, chills, body aches. Denies cough.

## 2018-03-04 ENCOUNTER — Encounter: Payer: Self-pay | Admitting: Neurology

## 2018-03-04 ENCOUNTER — Ambulatory Visit (INDEPENDENT_AMBULATORY_CARE_PROVIDER_SITE_OTHER): Payer: Medicare Other | Admitting: Neurology

## 2018-03-04 DIAGNOSIS — M79672 Pain in left foot: Secondary | ICD-10-CM

## 2018-03-04 NOTE — Progress Notes (Signed)
[facility]    Nerve / Sites Muscle Latency Ref. Amplitude Ref. Rel Amp Segments Distance Velocity Ref. Area    ms ms mV mV %  cm m/s m/s mVms  R Peroneal - EDB     Ankle EDB NR ?6.5 NR ?2.0 NR Ankle - EDB 9   NR     Fib head EDB 11.6  0.7   Fib head - Ankle 27 NR ?44 1.5     Pop fossa EDB 13.3  1.3  181 Pop fossa - Fib head 10 60 ?44 2.9         Pop fossa - Ankle      L Peroneal - EDB     Ankle EDB 4.8 ?6.5 3.0 ?2.0 100 Ankle - EDB 9   9.4     Fib head EDB 10.3  2.8  91.9 Fib head - Ankle 27 49 ?44 8.8     Pop fossa EDB 12.4  2.8  101 Pop fossa - Fib head 10 48 ?44 9.0         Pop fossa - Ankle      R Tibial - AH     Ankle AH 4.0 ?5.8 4.7 ?4.0 100 Ankle - AH 9   16.8     Pop fossa AH 12.3  4.8  103 Pop fossa - Ankle 34 41 ?41 14.8  L Tibial - AH     Ankle AH 4.6 ?5.8 6.3 ?4.0 100 Ankle - AH 9   13.6     Pop fossa AH 11.4  5.4  85.2 Pop fossa - Ankle 33 48 ?41 16.9             SNC    Nerve / Sites Rec. Site Peak Lat Ref.  Amp Ref. Segments Distance    ms ms V V  cm  R Sural - Ankle (Calf)     Calf Ankle 3.4 ?4.4 29 ?6 Calf - Ankle 14  L Sural - Ankle (Calf)     Calf Ankle 3.5 ?4.4 28 ?6 Calf - Ankle 14  R Superficial peroneal - Ankle     Lat leg Ankle 3.8 ?4.4 9 ?6 Lat leg - Ankle 14  L Superficial peroneal - Ankle     Lat leg Ankle 4.1 ?4.4 8 ?6 Lat leg - Ankle 14             F  Wave    Nerve F Lat Ref.   ms ms  R Tibial - AH 45.4 ?56.0  L Tibial - AH 45.8 ?56.0

## 2018-03-04 NOTE — Progress Notes (Signed)
Please refer to EMG and nerve conduction procedure note.  

## 2018-03-04 NOTE — Procedures (Signed)
     HISTORY:  Samantha Sutton is a 38 year old patient with a history of cerebral palsy, she has had multiple procedures in both legs over the years.  The patient has developed some pain primarily in the toes of the left foot.  The patient has some discomfort with weightbearing and while off of the feet.  The patient has a throbbing sensation in the toes at times.  She denies any low back pain.  The patient is being evaluated for a possible neuropathy or a lumbosacral radiculopathy.   NERVE CONDUCTION STUDIES:  Nerve conduction studies were performed on both lower extremities.  The distal motor latency for the right peroneal nerve was unobtainable.  Stimulation more proximally however does result in a low amplitude response.  The distal motor latency motor amplitudes for the left peroneal nerve and for the posterior tibial nerves bilaterally were normal.  The nerve conduction velocities for these nerves were normal.  The peroneal and sural sensory latencies were within norm limits bilaterally.  The F-wave latencies for the posterior tibial nerves were normal bilaterally.  EMG STUDIES:  EMG study was performed on the left lower extremity:  The tibialis anterior muscle reveals 2 to 3K motor units with decreased recruitment. No fibrillations or positive waves were seen. The peroneus tertius muscle reveals 2 to 4K motor units with decreased recruitment. No fibrillations or positive waves were seen. The medial gastrocnemius muscle reveals 1 to 3K motor units with decreased recruitment. No fibrillations or positive waves were seen.  The abductor hallucis muscle reveals no voluntary motor units but the insertional activity appears to be quite normal, no evidence of denervation seen. The vastus lateralis muscle reveals 2 to 4K motor units with decreased recruitment. No fibrillations or positive waves were seen. The iliopsoas muscle reveals 2 to 5K motor units with slightly decreased recruitment. No  fibrillations or positive waves were seen. The biceps femoris muscle (long head) reveals 2 to 4K motor units with full recruitment. No fibrillations or positive waves were seen. The lumbosacral paraspinal muscles were tested at 3 levels, and revealed no abnormalities of insertional activity at all 3 levels tested. There was good relaxation.   IMPRESSION:  Nerve conduction studies done on both lower extremities shows some evidence of distal dysfunction of the right peroneal nerve, otherwise no clear evidence of an overlying peripheral neuropathy.  EMG evaluation of the left lower extremity to include intrinsic muscles shows no significant abnormalities to suggest a distal neuropathy or an overlying lumbosacral radiculopathy.  Marlan Palau MD 03/04/2018 3:00 PM  [facility] [address] [place], [place] [address]  Phone [phone] Fax [phone]

## 2018-11-20 ENCOUNTER — Emergency Department (HOSPITAL_BASED_OUTPATIENT_CLINIC_OR_DEPARTMENT_OTHER)
Admission: EM | Admit: 2018-11-20 | Discharge: 2018-11-20 | Disposition: A | Discharge status: Home / Self Care | Payer: Medicare Other | Attending: Emergency Medicine | Admitting: Emergency Medicine

## 2018-11-20 ENCOUNTER — Encounter (HOSPITAL_BASED_OUTPATIENT_CLINIC_OR_DEPARTMENT_OTHER): Payer: Self-pay

## 2018-11-20 ENCOUNTER — Other Ambulatory Visit: Payer: Self-pay

## 2018-11-20 DIAGNOSIS — Z87891 Personal history of nicotine dependence: Secondary | ICD-10-CM | POA: Diagnosis not present

## 2018-11-20 DIAGNOSIS — G809 Cerebral palsy, unspecified: Secondary | ICD-10-CM | POA: Insufficient documentation

## 2018-11-20 DIAGNOSIS — N3001 Acute cystitis with hematuria: Secondary | ICD-10-CM | POA: Diagnosis not present

## 2018-11-20 DIAGNOSIS — R319 Hematuria, unspecified: Secondary | ICD-10-CM | POA: Diagnosis present

## 2018-11-20 LAB — URINALYSIS, ROUTINE W REFLEX MICROSCOPIC
Bilirubin Urine: NEGATIVE
Glucose, UA: NEGATIVE mg/dL
Ketones, ur: 15 mg/dL — AB
Nitrite: POSITIVE — AB
Protein, ur: 100 mg/dL — AB
Specific Gravity, Urine: >1.03 — ABNORMAL HIGH (ref 1.005–1.030)
pH: 6 (ref 5.0–8.0)

## 2018-11-20 LAB — URINALYSIS, MICROSCOPIC (REFLEX)
RBC / HPF: >50 RBC/hpf (ref 0–5)
WBC, UA: >50 WBC/hpf (ref 0–5)

## 2018-11-20 LAB — PREGNANCY, URINE: Preg Test, Ur: NEGATIVE

## 2018-11-20 MED ORDER — CEPHALEXIN 250 MG PO CAPS
500.0000 mg | ORAL_CAPSULE | Freq: Once | ORAL | Status: AC
  Administered 2018-11-20: 13:00:00 500 mg via ORAL
  Filled 2018-11-20: qty 2

## 2018-11-20 MED ORDER — CEPHALEXIN 500 MG PO CAPS: 500.0000 mg | ORAL_CAPSULE | Freq: Two times a day (BID) | ORAL | 0 refills | Status: AC

## 2018-11-20 MED FILL — CEPHALEXIN 500 MG CAPSULE: 500 | 7 days supply | Qty: 14 | Fill #0

## 2018-11-20 NOTE — ED Provider Notes (Signed)
[place] EMERGENCY DEPARTMENT Provider Note   CSN: [id number] Arrival date & time: 11/20/18  1151     History   Chief Complaint Chief Complaint  Patient presents with  . Hematuria    HPI Deion Samantha Sutton is a 38 y.o. female with a past medical history of urinary tract infections, presenting to the emergency department with symptoms of another urinary infection.  She reports several days of painful urination, with brown after she finishes peeing, and also noting some small frank blood in her urine.  She states this is similar to when she has had urine infections in the past.  She reports that her most recent infection was several years ago.  She denies any fevers, chills, flank pain, vomiting.  She reports mild nausea.  She denies any history of STD.  NKDA PMHx:  Cerebral palsy Medications: None at baseline     HPI  Past Medical History:  Diagnosis Date  . Cerebral palsy ([place])     There are no active problems to display for this patient.   Past Surgical History:  Procedure Laterality Date  . LEG SURGERY       OB History    Gravida  3   Para      Term      Preterm      AB      Living        SAB      TAB      Ectopic      Multiple      Live Births               Home Medications    Prior to Admission medications   Medication Sig Start Date End Date Taking? Authorizing Provider  cephALEXin (KEFLEX) 500 MG capsule Take 1 capsule (500 mg total) by mouth 2 (two) times daily for 7 days. 11/20/18 11/27/18  Wyvonnia Dusky, MD    Family History Family History  Problem Relation Age of Onset  . Diabetes Mother   . High Cholesterol Mother   . Hypertension Mother   . Diabetes Maternal Grandmother   . Hypertension Maternal Grandmother   . High Cholesterol Maternal Grandmother   . Diabetes Paternal Grandmother   . High Cholesterol Paternal Grandmother     Social History Social History   Tobacco Use  . Smoking status: Former  Research scientist (life sciences)  . Smokeless tobacco: Never Used  Substance Use Topics  . Alcohol use: No  . Drug use: Yes    Types: Marijuana     Allergies   Vicodin [hydrocodone-acetaminophen]   Review of Systems Review of Systems  Constitutional: Negative for chills and fever.  Gastrointestinal: Positive for nausea. Negative for abdominal pain and vomiting.  Genitourinary: Positive for dysuria, frequency and urgency. Negative for difficulty urinating, hematuria, vaginal bleeding, vaginal discharge and vaginal pain.  Psychiatric/Behavioral: Negative for agitation and confusion.  All other systems reviewed and are negative.    Physical Exam Updated Vital Signs BP 119/71 (BP Location: Left Arm)   Pulse 89   Temp 98.5 F (36.9 C) (Oral)   Resp 18   Ht 5' (1.524 m)   Wt 49.9 kg   LMP 01/11/2018   SpO2 100%   Breastfeeding Unknown   BMI 21.48 kg/m   Physical Exam Vitals signs and nursing note reviewed.  Constitutional:      General: She is not in acute distress.    Appearance: She is well-developed.  HENT:     Head:  Normocephalic and atraumatic.  Eyes:     Conjunctiva/sclera: Conjunctivae normal.     Pupils: Pupils are equal, round, and reactive to light.  Neck:     Musculoskeletal: Neck supple.  Cardiovascular:     Rate and Rhythm: Normal rate and regular rhythm.  Pulmonary:     Effort: Pulmonary effort is normal. No respiratory distress.  Abdominal:     Tenderness: There is no right CVA tenderness or left CVA tenderness.  Skin:    General: Skin is warm and dry.  Neurological:     General: No focal deficit present.     Mental Status: She is alert and oriented to person, place, and time.  Psychiatric:        Mood and Affect: Mood normal.        Behavior: Behavior normal.    ED Treatments / Results  Labs (all labs ordered are listed, but only abnormal results are displayed) Labs Reviewed  URINALYSIS, ROUTINE W REFLEX MICROSCOPIC - Abnormal; Notable for the following  components:      Result Value   APPearance CLOUDY (*)    Specific Gravity, Urine >1.030 (*)    Hgb urine dipstick LARGE (*)    Ketones, ur 15 (*)    Protein, ur 100 (*)    Nitrite POSITIVE (*)    Leukocytes,Ua TRACE (*)    All other components within normal limits  URINALYSIS, MICROSCOPIC (REFLEX) - Abnormal; Notable for the following components:   Bacteria, UA MANY (*)    All other components within normal limits  URINE CULTURE  PREGNANCY, URINE    EKG None  Radiology No results found.  Procedures Procedures (including critical care time)  Medications Ordered in ED Medications  cephALEXin (KEFLEX) capsule 500 mg (500 mg Oral Given 11/20/18 1259)     Initial Impression / Assessment and Plan / ED Course  I have reviewed the triage vital signs and the nursing notes.  Pertinent labs & imaging results that were available during my care of the patient were reviewed by me and considered in my medical decision making (see chart for details).  Patient is a 37 year old female presenting to this department with symptoms of urinary tract infection.  The symptoms are familiar to her.  I exam she is extremely well-appearing, is afebrile, she has no flank tenderness to suggest pyelonephritis.  Suspect this is uncomplicated cystitis.  Treat with Keflex.  She has no known drug allergies.  I cannot find any prior urine culture data in our system.  Clinical Course as of Nov 19 1816  Fri Nov 20, 2018  1249 Nitrite(!): POSITIVE [MT]  1249 Leukocytes,Ua(!): TRACE [MT]    Clinical Course User Index [MT] Terald Sleeper, MD    Final Clinical Impressions(s) / ED Diagnoses   Final diagnoses:  Acute cystitis with hematuria    ED Discharge Orders         Ordered    cephALEXin (KEFLEX) 500 MG capsule  2 times daily     11/20/18 1254           Terald Sleeper, MD 11/20/18 1819

## 2018-11-20 NOTE — ED Triage Notes (Signed)
Pt states she feels se has a UTI-c/o hematuria and dysuria-NAD

## 2018-11-22 LAB — URINE CULTURE: Culture: >=100000 — AB

## 2018-11-23 ENCOUNTER — Telehealth: Payer: Self-pay | Admitting: *Deleted

## 2018-11-23 NOTE — Telephone Encounter (Signed)
Post ED Visit - Positive Culture Follow-up  Culture report reviewed by antimicrobial stewardship pharmacist: [place] Team []  Elenor Quinones, Pharm.D. []  Heide Guile, Pharm.D., BCPS AQ-ID []  Parks Neptune, Pharm.D., BCPS []  Alycia Rossetti, Pharm.D., BCPS []  [place], [place].D., BCPS, AAHIVP []  Legrand Como, Pharm.D., BCPS, AAHIVP [x]  Salome Arnt, PharmD, BCPS []  Johnnette Gourd, PharmD, BCPS []  Hughes Better, PharmD, BCPS []  Leeroy Cha, PharmD []  Laqueta Linden, PharmD, BCPS []  Albertina Parr, PharmD  [place] Team []  Leodis Sias, PharmD []  Lindell Spar, PharmD []  Royetta Asal, PharmD []  Graylin Shiver, Rph []  Rema Fendt) Glennon Mac, PharmD []  Arlyn Dunning, PharmD []  Netta Cedars, PharmD []  Dia Sitter, PharmD []  Leone Haven, PharmD []  Gretta Arab, PharmD []  Theodis Shove, PharmD []  Peggyann Juba, PharmD []  Reuel Boom, PharmD   Positive urine culture Treated with Cephalexin, organism sensitive to the same and no further patient follow-up is required at this time.  Harlon Flor [facility] 11/23/2018, 4:22 PM

## 2020-10-10 ENCOUNTER — Other Ambulatory Visit: Payer: Self-pay

## 2020-10-10 ENCOUNTER — Encounter: Payer: Self-pay | Admitting: Family Medicine

## 2020-10-10 ENCOUNTER — Ambulatory Visit (INDEPENDENT_AMBULATORY_CARE_PROVIDER_SITE_OTHER): Payer: 59 | Admitting: Family Medicine

## 2020-10-10 ENCOUNTER — Ambulatory Visit (INDEPENDENT_AMBULATORY_CARE_PROVIDER_SITE_OTHER): Payer: 59

## 2020-10-10 VITALS — BP 110/66 | HR 71 | Temp 97.3°F | Resp 12 | Ht 60.0 in | Wt 101.0 lb

## 2020-10-10 DIAGNOSIS — Z7689 Persons encountering health services in other specified circumstances: Secondary | ICD-10-CM | POA: Diagnosis not present

## 2020-10-10 DIAGNOSIS — M79645 Pain in left finger(s): Secondary | ICD-10-CM

## 2020-10-10 DIAGNOSIS — M25561 Pain in right knee: Secondary | ICD-10-CM

## 2020-10-10 DIAGNOSIS — M25562 Pain in left knee: Secondary | ICD-10-CM

## 2020-10-10 DIAGNOSIS — G8929 Other chronic pain: Secondary | ICD-10-CM

## 2020-10-10 MED ORDER — PREDNISONE 50 MG PO TABS: ORAL_TABLET | ORAL | 0 refills | Status: DC

## 2020-10-10 NOTE — Progress Notes (Signed)
Left knee- 6/10 pain, intermittent x 3 mos.  Takes Tylenol and wear knee brace.   Knot on finger, left 3rd digit  Feeling cold all the time

## 2020-10-11 NOTE — Progress Notes (Signed)
New Patient Office Visit  Subjective:  Patient ID: Samantha Sutton, female    DOB: 10-Jul-1980  Age: 40 y.o. MRN: [id number]  CC:  Chief Complaint  Patient presents with   Knee Pain   Establish Care    HPI Samantha Sutton presents for to establish care. Patents primary complaint today is that she has an area on her left middle finger that is growing and increasingly painful. This has been for weeks if not months.She denies known injury or trauma. Patient is right hand dominant.  Patient also reports that she has chronic pain in her knees.   Past Medical History:  Diagnosis Date   Cerebral palsy [facility])     Past Surgical History:  Procedure Laterality Date   LEG SURGERY        Social History   Socioeconomic History   Marital status: Single    Spouse name: Not on file   Number of children: Not on file   Years of education: Not on file   Highest education level: Not on file  Occupational History   Not on file  Tobacco Use   Smoking status: Former   Smokeless tobacco: Never  Vaping Use   Vaping Use: Never used  Substance and Sexual Activity   Alcohol use: No   Drug use: Yes    Types: Marijuana   Sexual activity: Not on file  Other Topics Concern   Not on file  Social History Narrative   Not on file   Social Determinants of Health   Financial Resource Strain: Not on file  Food Insecurity: Not on file  Transportation Needs: Not on file  Physical Activity: Not on file  Stress: Not on file  Social Connections: Not on file  Intimate Partner Violence: Not on file    ROS Review of Systems  Musculoskeletal:  Positive for joint swelling.  All other systems reviewed and are negative.  Objective:   Today's Vitals: BP 110/66 (BP Location: Right Arm, Patient Position: Sitting, Cuff Size: Normal)   Pulse 71   Temp (!) 97.3 F (36.3 C)   Resp 12   Ht 5' (1.524 m)   Wt 101 lb (45.8 kg)   LMP 09/14/2020   SpO2 98%   BMI 19.73 kg/m   Physical Exam Vitals  and nursing note reviewed.  Constitutional:      General: She is not in acute distress. Cardiovascular:     Rate and Rhythm: Normal rate and regular rhythm.  Pulmonary:     Effort: Pulmonary effort is normal.     Breath sounds: Normal breath sounds.  Musculoskeletal:     Right knee: No deformity or effusion. Tenderness present.     Left knee: No deformity or effusion. Tenderness present.     Comments: Left middle digit with small hard deformity and ttp at the PIP joint. Some decreased ROM  Neurological:     General: No focal deficit present.     Mental Status: She is alert and oriented to person, place, and time.    Assessment & Plan:   1. Pain of left middle finger Referral for xray; plan referral to ortho for further eval/mgt pending results  - DG Finger Middle Left; Future  2. Chronic pain of both knees Prednisone prescribed - will monitor  - predniSONE (DELTASONE) 50 MG tablet; 1 po daily for 5 days  Dispense: 5 tablet; Refill: 0  3. Encounter to establish care     Outpatient Encounter Medications as of 10/10/2020  Medication Sig   predniSONE (DELTASONE) 50 MG tablet 1 po daily for 5 days   No facility-administered encounter medications on file as of 10/10/2020.    Follow-up: No follow-ups on file.   Tommie Raymond, MD

## 2020-11-21 ENCOUNTER — Encounter: Payer: Self-pay | Admitting: Family Medicine

## 2020-11-21 ENCOUNTER — Other Ambulatory Visit: Payer: Self-pay

## 2020-11-21 ENCOUNTER — Ambulatory Visit (INDEPENDENT_AMBULATORY_CARE_PROVIDER_SITE_OTHER): Payer: 59 | Admitting: Family Medicine

## 2020-11-21 VITALS — BP 113/70 | HR 86 | Temp 98.3°F | Resp 16 | Ht 60.0 in | Wt 104.2 lb

## 2020-11-21 DIAGNOSIS — G8929 Other chronic pain: Secondary | ICD-10-CM

## 2020-11-21 DIAGNOSIS — G809 Cerebral palsy, unspecified: Secondary | ICD-10-CM | POA: Diagnosis not present

## 2020-11-21 DIAGNOSIS — M25561 Pain in right knee: Secondary | ICD-10-CM

## 2020-11-21 DIAGNOSIS — M25562 Pain in left knee: Secondary | ICD-10-CM | POA: Diagnosis not present

## 2020-11-21 NOTE — Progress Notes (Signed)
Established Patient Office Visit  Subjective:  Patient ID: Samantha Sutton, female    DOB: 08/20/1980  Age: 40 y.o. MRN: [id number]  CC:  Chief Complaint  Patient presents with   Follow-up    HPI Peter Daquila presents for follow-up on knee pain.  Patient reports that management with prednisone did not help alleviate symptoms.  She has also been through myriad of other things including physical therapy.  She reports her symptoms are worsening.  She believes that is related to her cerebral palsy.  Past Medical History:  Diagnosis Date   Cerebral palsy [facility])     Past Surgical History:  Procedure Laterality Date   LEG SURGERY        Social History   Socioeconomic History   Marital status: Single    Spouse name: Not on file   Number of children: Not on file   Years of education: Not on file   Highest education level: Not on file  Occupational History   Not on file  Tobacco Use   Smoking status: Former   Smokeless tobacco: Never  Vaping Use   Vaping Use: Never used  Substance and Sexual Activity   Alcohol use: No   Drug use: Yes    Types: Marijuana   Sexual activity: Not on file  Other Topics Concern   Not on file  Social History Narrative   Not on file   Social Determinants of Health   Financial Resource Strain: Not on file  Food Insecurity: Not on file  Transportation Needs: Not on file  Physical Activity: Not on file  Stress: Not on file  Social Connections: Not on file  Intimate Partner Violence: Not on file    ROS Review of Systems  Musculoskeletal:  Positive for gait problem.  All other systems reviewed and are negative.  Objective:   Today's Vitals: BP 113/70 (BP Location: Right Arm, Patient Position: Sitting, Cuff Size: Normal)   Pulse 86   Temp 98.3 F (36.8 C) (Oral)   Resp 16   Ht 5' (1.524 m)   Wt 104 lb 3.2 oz (47.3 kg)   SpO2 96%   BMI 20.35 kg/m   Physical Exam Vitals and nursing note reviewed.  Constitutional:       General: She is not in acute distress. Cardiovascular:     Rate and Rhythm: Normal rate and regular rhythm.  Pulmonary:     Effort: Pulmonary effort is normal.     Breath sounds: Normal breath sounds.  Musculoskeletal:     Right knee: No deformity or effusion. Tenderness present.     Left knee: No deformity or effusion. Tenderness present.  Neurological:     General: No focal deficit present.     Mental Status: She is alert and oriented to person, place, and time.     Gait: Gait abnormal.    Assessment & Plan:    1. Chronic pain of both knees Patient referred to Ortho for further eval and management. - Ambulatory referral to Orthopedic Surgery  2. Cerebral palsy, unspecified type ([place]) As above.   Outpatient Encounter Medications as of 11/21/2020  Medication Sig   predniSONE (DELTASONE) 50 MG tablet 1 po daily for 5 days (Patient not taking: Reported on 11/21/2020)   No facility-administered encounter medications on file as of 11/21/2020.    Follow-up: Return if symptoms worsen or fail to improve.   Becky Sax, MD

## 2020-11-21 NOTE — Progress Notes (Signed)
Patient is here for f/u of medication for knees. Patient said medication did not help. Patient is still hear sounds from knees

## 2020-11-27 ENCOUNTER — Other Ambulatory Visit (HOSPITAL_BASED_OUTPATIENT_CLINIC_OR_DEPARTMENT_OTHER): Payer: Self-pay | Admitting: Orthopaedic Surgery

## 2020-11-27 DIAGNOSIS — M25562 Pain in left knee: Secondary | ICD-10-CM

## 2020-11-27 DIAGNOSIS — M25561 Pain in right knee: Secondary | ICD-10-CM

## 2020-11-28 ENCOUNTER — Ambulatory Visit (HOSPITAL_BASED_OUTPATIENT_CLINIC_OR_DEPARTMENT_OTHER): Payer: 59 | Admitting: Orthopaedic Surgery

## 2020-12-04 ENCOUNTER — Other Ambulatory Visit: Payer: Self-pay

## 2020-12-04 ENCOUNTER — Ambulatory Visit (HOSPITAL_BASED_OUTPATIENT_CLINIC_OR_DEPARTMENT_OTHER)
Admission: RE | Admit: 2020-12-04 | Discharge: 2020-12-04 | Disposition: A | Discharge status: Home / Self Care | Payer: 59 | Source: Ambulatory Visit | Attending: Orthopaedic Surgery | Admitting: Orthopaedic Surgery

## 2020-12-04 ENCOUNTER — Ambulatory Visit (INDEPENDENT_AMBULATORY_CARE_PROVIDER_SITE_OTHER): Payer: 59 | Admitting: Orthopaedic Surgery

## 2020-12-04 VITALS — Ht 60.0 in | Wt 104.0 lb

## 2020-12-04 DIAGNOSIS — M25562 Pain in left knee: Secondary | ICD-10-CM

## 2020-12-04 DIAGNOSIS — M222X2 Patellofemoral disorders, left knee: Secondary | ICD-10-CM

## 2020-12-04 DIAGNOSIS — M222X1 Patellofemoral disorders, right knee: Secondary | ICD-10-CM

## 2020-12-04 DIAGNOSIS — M25561 Pain in right knee: Secondary | ICD-10-CM | POA: Diagnosis not present

## 2020-12-04 NOTE — Progress Notes (Signed)
Chief Complaint: bilateral knee pain     History of Present Illness:    Samantha Sutton is a 40 y.o. female with right worse than left knee pain now going on for several years.  Of note she does have a history of cerebral palsy which leaves her with a abnormal gait.  She does not use a cane or walker or any type of assistive devices.  She has tried Tylenol which does not help.  She endorses some crepitus in the right knee particularly with going on the stairs.  The pain is dull and located behind the patella bilaterally.  She does endorse a history of injections in the past but these did not significantly help    Surgical History:   None  PMH/PSH/Family History/Social History/Meds/Allergies:    Past Medical History:  Diagnosis Date   Cerebral palsy ([facility])    Past Surgical History:  Procedure Laterality Date   LEG SURGERY     Social History   Socioeconomic History   Marital status: Single    Spouse name: Not on file   Number of children: Not on file   Years of education: Not on file   Highest education level: Not on file  Occupational History   Not on file  Tobacco Use   Smoking status: Former   Smokeless tobacco: Never  Vaping Use   Vaping Use: Never used  Substance and Sexual Activity   Alcohol use: No   Drug use: Yes    Types: Marijuana   Sexual activity: Not on file  Other Topics Concern   Not on file  Social History Narrative   Not on file   Social Determinants of Health   Financial Resource Strain: Not on file  Food Insecurity: Not on file  Transportation Needs: Not on file  Physical Activity: Not on file  Stress: Not on file  Social Connections: Not on file   Family History  Problem Relation Age of Onset   Diabetes Mother    High Cholesterol Mother    Hypertension Mother    Diabetes Maternal Grandmother    Hypertension Maternal Grandmother    High Cholesterol Maternal Grandmother    Diabetes Paternal  Grandmother    High Cholesterol Paternal Grandmother    Allergies  Allergen Reactions   Vicodin [Hydrocodone-Acetaminophen] Itching   Current Outpatient Medications  Medication Sig Dispense Refill   predniSONE (DELTASONE) 50 MG tablet 1 po daily for 5 days (Patient not taking: Reported on 11/21/2020) 5 tablet 0   No current facility-administered medications for this visit.   No results found.  Review of Systems:   A ROS was performed including pertinent positives and negatives as documented in the HPI.  Physical Exam :   Constitutional: NAD and appears stated age Neurological: Alert and oriented Psych: Appropriate affect and cooperative Height 5' (1.524 m), weight 104 lb (47.2 kg), unknown if currently breastfeeding.   Comprehensive Musculoskeletal Exam:      Musculoskeletal Exam  Gait Back walls left knee right knee remains flexed during gait  Alignment Normal   Right Left  Inspection Quad atrophy Quad atrophy  Palpation    Tenderness Patellofemoral Patellofemoral  Crepitus Patellofemoral None  Effusion None None  Range of Motion    Extension 0 0  Flexion 135 135  Strength    Extension  5/5 5/5  Flexion 5/5 5/5  Ligament Exam     Generalized Laxity No No  Lachman Negative Negative   Pivot Shift Negative Negative  Anterior Drawer Negative Negative  Valgus at 0 Negative Negative  Valgus at 20 Negative Negative  Varus at 0 0 0  Varus at 20   0 0  Posterior Drawer at 90 0 0  Vascular/Lymphatic Exam    Edema None None  Venous Stasis Changes No No  Distal Circulation Normal Normal  Neurologic    Light Touch Sensation Intact Intact  Special Tests:    Positive patellar grind  Imaging:   Xray (4 views bilateral knees): 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   Assessment:   Samantha Sutton very pleasant 40 year old female with bilateral patellofemoral pain in the setting of an abnormal gait with  cerebral palsy.  I described that the majority of her symptoms are coming from the patellofemoral joint.  I would like to provide her with a patellofemoral brace on the right side of this as she is having crepitus which is somewhat bothersome for him.  I would also like to send her for physical therapy for hip and quad strengthening on both sides as I believe this would significantly improve her kinetic chain and to minimize knee symptoms.  I did discuss that I could perform an injection although this is somewhat of a transient treatment given that I believe the issue stems predominantly from her hip and knee weakness  Plan :    -Plan for physical therapy for hip core and quad strengthening -Return to clinic 1 month if no improvement -Right knee patella J brace provided      I personally saw and evaluated the patient, and participated in the management and treatment plan.  Huel Cote, MD Attending Physician, Orthopedic Surgery  This document was dictated using Dragon voice recognition software. A reasonable attempt at proof reading has been made to minimize errors.

## 2021-05-02 ENCOUNTER — Ambulatory Visit (HOSPITAL_BASED_OUTPATIENT_CLINIC_OR_DEPARTMENT_OTHER): Payer: 59 | Admitting: Orthopaedic Surgery

## 2021-05-10 ENCOUNTER — Ambulatory Visit (INDEPENDENT_AMBULATORY_CARE_PROVIDER_SITE_OTHER): Payer: 59 | Admitting: Family Medicine

## 2021-05-10 ENCOUNTER — Other Ambulatory Visit: Payer: Self-pay

## 2021-05-10 VITALS — BP 117/74 | HR 69 | Temp 98.0°F | Resp 16 | Ht 60.0 in | Wt 109.2 lb

## 2021-05-10 DIAGNOSIS — M25562 Pain in left knee: Secondary | ICD-10-CM

## 2021-05-10 DIAGNOSIS — M25561 Pain in right knee: Secondary | ICD-10-CM

## 2021-05-10 DIAGNOSIS — G8929 Other chronic pain: Secondary | ICD-10-CM

## 2021-05-10 DIAGNOSIS — G809 Cerebral palsy, unspecified: Secondary | ICD-10-CM

## 2021-05-10 DIAGNOSIS — R5383 Other fatigue: Secondary | ICD-10-CM | POA: Diagnosis not present

## 2021-05-10 DIAGNOSIS — Z0289 Encounter for other administrative examinations: Secondary | ICD-10-CM | POA: Diagnosis not present

## 2021-05-10 NOTE — Progress Notes (Signed)
Patient is here to get FMLA paper work fill out . ? ?For the past three weeks patient has been feeling weak and not feeling like doing anything. Patient would like her iron check ?

## 2021-05-11 ENCOUNTER — Encounter: Payer: Self-pay | Admitting: Family Medicine

## 2021-05-11 ENCOUNTER — Ambulatory Visit (INDEPENDENT_AMBULATORY_CARE_PROVIDER_SITE_OTHER): Payer: 59

## 2021-05-11 DIAGNOSIS — Z0000 Encounter for general adult medical examination without abnormal findings: Secondary | ICD-10-CM | POA: Diagnosis not present

## 2021-05-11 LAB — CBC WITH DIFFERENTIAL/PLATELET
Basophils Absolute: 0 10*3/uL (ref 0.0–0.2)
Basos: 1 %
EOS (ABSOLUTE): 0.1 10*3/uL (ref 0.0–0.4)
Eos: 1 %
Hematocrit: 38.2 % (ref 34.0–46.6)
Hemoglobin: 12.7 g/dL (ref 11.1–15.9)
Immature Grans (Abs): 0 10*3/uL (ref 0.0–0.1)
Immature Granulocytes: 0 %
Lymphocytes Absolute: 2.9 10*3/uL (ref 0.7–3.1)
Lymphs: 48 %
MCH: 30.8 pg (ref 26.6–33.0)
MCHC: 33.2 g/dL (ref 31.5–35.7)
MCV: 93 fL (ref 79–97)
Monocytes Absolute: 0.6 10*3/uL (ref 0.1–0.9)
Monocytes: 9 %
Neutrophils Absolute: 2.5 10*3/uL (ref 1.4–7.0)
Neutrophils: 41 %
Platelets: 291 10*3/uL (ref 150–450)
RBC: 4.12 x10E6/uL (ref 3.77–5.28)
RDW: 12.3 % (ref 11.7–15.4)
WBC: 6.1 10*3/uL (ref 3.4–10.8)

## 2021-05-11 LAB — CMP14+EGFR
ALT: 18 IU/L (ref 0–32)
AST: 21 IU/L (ref 0–40)
Albumin/Globulin Ratio: 2 (ref 1.2–2.2)
Albumin: 4.5 g/dL (ref 3.8–4.8)
Alkaline Phosphatase: 53 IU/L (ref 44–121)
BUN/Creatinine Ratio: 13 (ref 9–23)
BUN: 10 mg/dL (ref 6–20)
Bilirubin Total: 0.3 mg/dL (ref 0.0–1.2)
CO2: 24 mmol/L (ref 20–29)
Calcium: 9.3 mg/dL (ref 8.7–10.2)
Chloride: 104 mmol/L (ref 96–106)
Creatinine, Ser: 0.76 mg/dL (ref 0.57–1.00)
Globulin, Total: 2.3 g/dL (ref 1.5–4.5)
Glucose: 82 mg/dL (ref 70–99)
Potassium: 4.7 mmol/L (ref 3.5–5.2)
Sodium: 138 mmol/L (ref 134–144)
Total Protein: 6.8 g/dL (ref 6.0–8.5)
eGFR: 103 mL/min/{1.73_m2} (ref >59)

## 2021-05-11 NOTE — Progress Notes (Signed)
? ?Established Patient Office Visit ? ?Subjective:  ?Patient ID: Samantha Sutton, female    DOB: [birth year]  Age: 41 y.o. MRN: [id number] ? ?CC:  ?Chief Complaint  ?Patient presents with  ? Follow-up  ? Leg Pain  ? FMLA  ? ? ?HPI ?Nicola Girt presents for complaint of fatigue. Patient also desires completion of FMLA form.  ? ?Past Medical History:  ?Diagnosis Date  ? Cerebral palsy ([place])   ? ? ?Past Surgical History:  ?Procedure Laterality Date  ? LEG SURGERY    ? ? ?Family History  ?Problem Relation Age of Onset  ? Diabetes Mother   ? High Cholesterol Mother   ? Hypertension Mother   ? Diabetes Maternal Grandmother   ? Hypertension Maternal Grandmother   ? High Cholesterol Maternal Grandmother   ? Diabetes Paternal Grandmother   ? High Cholesterol Paternal Grandmother   ? ? ?Social History  ? ?Socioeconomic History  ? Marital status: Single  ?  Spouse name: Not on file  ? Number of children: Not on file  ? Years of education: Not on file  ? Highest education level: Not on file  ?Occupational History  ? Not on file  ?Tobacco Use  ? Smoking status: Former  ? Smokeless tobacco: Never  ?Vaping Use  ? Vaping Use: Never used  ?Substance and Sexual Activity  ? Alcohol use: No  ? Drug use: Yes  ?  Types: Marijuana  ? Sexual activity: Not on file  ?Other Topics Concern  ? Not on file  ?Social History Narrative  ? Not on file  ? ?Social Determinants of Health  ? ?Financial Resource Strain: Not on file  ?Food Insecurity: Not on file  ?Transportation Needs: Not on file  ?Physical Activity: Not on file  ?Stress: Not on file  ?Social Connections: Not on file  ?Intimate Partner Violence: Not on file  ? ? ?ROS ?Review of Systems  ?Constitutional:  Positive for activity change and fatigue. Negative for chills, fever and unexpected weight change.  ?All other systems reviewed and are negative. ? ?Objective:  ? ?Today's Vitals: BP 117/74   Pulse 69   Temp 98 ?F (36.7 ?C) (Oral)   Resp 16   Ht 5' (1.524 m)   Wt 109 lb 3.2 oz  (49.5 kg)   SpO2 98%   BMI 21.33 kg/m?  ? ?Physical Exam ?Vitals and nursing note reviewed.  ?Constitutional:   ?   General: She is not in acute distress. ?Cardiovascular:  ?   Rate and Rhythm: Normal rate and regular rhythm.  ?Pulmonary:  ?   Effort: Pulmonary effort is normal.  ?   Breath sounds: Normal breath sounds.  ?Musculoskeletal:  ?   Right knee: No deformity or effusion. Tenderness present.  ?   Left knee: No deformity or effusion. Tenderness present.  ?Neurological:  ?   General: No focal deficit present.  ?   Mental Status: She is alert and oriented to person, place, and time.  ?   Gait: Gait abnormal.  ? ? ?Assessment & Plan:  ? ?1. Other fatigue ?Labs ordered. Discussed good sleep hygiene and adequate sleep habits.  ?- CMP14+EGFR ?- CBC with Differential ? ?2. Cerebral palsy, unspecified type ([place]) ?Appears stable ? ?3. Chronic pain of both knees ?Appears stable ? ?4. Encounter for completion of form with patient ?FMLA form completed ? ?Outpatient Encounter Medications as of 05/10/2021  ?Medication Sig  ? ibuprofen (ADVIL) 800 MG tablet Take by mouth.  ? predniSONE (DELTASONE)  50 MG tablet 1 po daily for 5 days (Patient not taking: Reported on 11/21/2020)  ? ?No facility-administered encounter medications on file as of 05/10/2021.  ? ? ?Follow-up: No follow-ups on file.  ? ?Becky Sax, MD ? ?

## 2021-05-11 NOTE — Progress Notes (Signed)
? ?Subjective:  ? Samantha Sutton is a 41 y.o. female who presents for an Initial Medicare Annual Wellness Visit. ?   ?Objective:  ?  ?Today's Vitals  ? 05/11/21 1241  ?PainSc: 7   ? ?There is no height or weight on file to calculate BMI. ? ?Advanced Directives 11/20/2018 02/15/2018 06/28/2014  ?Does Patient Have a Medical Advance Directive? No No No  ?Would patient like information on creating a medical advance directive? - No - Patient declined No - patient declined information  ? ? ?Current Medications (verified) ?Outpatient Encounter Medications as of 05/11/2021  ?Medication Sig  ? [DISCONTINUED] ibuprofen (ADVIL) 800 MG tablet Take by mouth. (Patient not taking: Reported on 05/11/2021)  ? [DISCONTINUED] predniSONE (DELTASONE) 50 MG tablet 1 po daily for 5 days  ? ?No facility-administered encounter medications on file as of 05/11/2021.  ? ? ?Allergies (verified) ?Vicodin [hydrocodone-acetaminophen] and Hydrocodone-acetaminophen  ? ?History: ?Past Medical History:  ?Diagnosis Date  ? Cerebral palsy (HCC)   ? ?Past Surgical History:  ?Procedure Laterality Date  ? LEG SURGERY    ? ?Family History  ?Problem Relation Age of Onset  ? Diabetes Mother   ? High Cholesterol Mother   ? Hypertension Mother   ? Diabetes Maternal Grandmother   ? Hypertension Maternal Grandmother   ? High Cholesterol Maternal Grandmother   ? Diabetes Paternal Grandmother   ? High Cholesterol Paternal Grandmother   ? ?Social History  ? ?Socioeconomic History  ? Marital status: Single  ?  Spouse name: Not on file  ? Number of children: Not on file  ? Years of education: Not on file  ? Highest education level: Not on file  ?Occupational History  ? Not on file  ?Tobacco Use  ? Smoking status: Former  ? Smokeless tobacco: Never  ?Vaping Use  ? Vaping Use: Never used  ?Substance and Sexual Activity  ? Alcohol use: No  ? Drug use: Yes  ?  Types: Marijuana  ? Sexual activity: Not on file  ?Other Topics Concern  ? Not on file  ?Social History Narrative  ?  Not on file  ? ?Social Determinants of Health  ? ?Financial Resource Strain: Medium Risk  ? Difficulty of Paying Living Expenses: Somewhat hard  ?Food Insecurity: No Food Insecurity  ? Worried About Programme researcher, broadcasting/film/video in the Last Year: Never true  ? Ran Out of Food in the Last Year: Never true  ?Transportation Needs: No Transportation Needs  ? Lack of Transportation (Medical): No  ? Lack of Transportation (Non-Medical): No  ?Physical Activity: Inactive  ? Days of Exercise per Week: 0 days  ? Minutes of Exercise per Session: 0 min  ?Stress: Stress Concern Present  ? Feeling of Stress : To some extent  ?Social Connections: Socially Isolated  ? Frequency of Communication with Friends and Family: More than three times a week  ? Frequency of Social Gatherings with Friends and Family: Twice a week  ? Attends Religious Services: Never  ? Active Member of Clubs or Organizations: No  ? Attends Banker Meetings: Never  ? Marital Status: Never married  ? ? ?Tobacco Counseling ?Counseling given: Not Answered ? ? ?Clinical Intake: ? ?Pre-visit preparation completed: Yes ? ?Pain : 0-10 ?Pain Score: 7  ?Pain Location: Knee ?Pain Orientation: Right, Left ?Pain Descriptors / Indicators: Throbbing ?Pain Onset: More than a month ago ?Pain Frequency: Constant ? ?  ? ?Nutritional Risks: None ?Diabetes: No ? ?What is the last grade level you  completed in school?: some college ? ?Diabetic?no ? ?Interpreter Needed?: No ? ?  ? ? ?Activities of Daily Living ?In your present state of health, do you have any difficulty performing the following activities: 05/11/2021  ?Hearing? N  ?Vision? N  ?Difficulty concentrating or making decisions? N  ?Walking or climbing stairs? Y  ?Dressing or bathing? N  ?Doing errands, shopping? N  ?Some recent data might be hidden  ? ? ?Patient Care Team: ?Dorna Mai, MD as PCP - General (Family Medicine) ?Francee Piccolo, MD as Referring Physician (Podiatry) ? ?Indicate any recent Medical Services you  may have received from other than [facility] providers in the past year (date may be approximate). ? ?   ?Assessment:  ? This is a routine wellness examination for Samantha Sutton. ? ?Hearing/Vision screen ?No results found. ? ?Dietary issues and exercise activities discussed: ?  ? ? Goals Addressed   ?None ?  ?Depression Screen ?PHQ 2/9 Scores 05/11/2021 10/10/2020  ?PHQ - 2 Score 0 3  ?PHQ- 9 Score - 9  ?  ?Fall Risk ?Fall Risk  05/11/2021  ?Falls in the past year? 1  ?Number falls in past yr: 1  ?Injury with Fall? 1  ?Risk for fall due to : History of fall(s)  ?Follow up Falls evaluation completed  ? ? ?FALL RISK PREVENTION PERTAINING TO THE HOME: ? ?Any stairs in or around the home? Yes  ?If so, are there any without handrails? Yes  ?Home free of loose throw rugs in walkways, pet beds, electrical cords, etc? Yes  ?Adequate lighting in your home to reduce risk of falls? Yes  ? ?ASSISTIVE DEVICES UTILIZED TO PREVENT FALLS: ? ?Life alert? No  ?Use of a cane, walker or w/c? No  ?Grab bars in the bathroom? No  ?Shower chair or bench in shower?  N/a ?Elevated toilet seat or a handicapped toilet? No  ? ?Cognitive Function: ?  ?  ?6CIT Screen 05/11/2021  ?What Year? 0 points  ?What month? 0 points  ?What time? 0 points  ?Count back from 20 0 points  ?Months in reverse 0 points  ?Repeat phrase 0 points  ?Total Score 0  ? ? ?Immunizations ?Immunization History  ?Administered Date(s) Administered  ? PPD Test 10/04/2013  ? ? ?TDAP status: Up to date ? ?Flu Vaccine status: Up to date pt stated she received it at an event.  ? ? ?Covid-19 vaccine status: Declined, Education has been provided regarding the importance of this vaccine but patient still declined. Advised may receive this vaccine at local pharmacy or Health Dept.or vaccine clinic. Aware to provide a copy of the vaccination record if obtained from local pharmacy or Health Dept. Verbalized acceptance and understanding. ? ?Qualifies for Shingles Vaccine? No   ?Zostavax completed  n/a    ? ?Screening Tests ?Health Maintenance  ?Topic Date Due  ? COVID-19 Vaccine (1) Never done  ? HIV Screening  Never done  ? Hepatitis C Screening  Never done  ? PAP SMEAR-Modifier  Never done  ? INFLUENZA VACCINE  09/25/2020  ? TETANUS/TDAP  03/26/2026  ? HPV VACCINES  Aged Out  ? ? ?Health Maintenance ? ?Health Maintenance Due  ?Topic Date Due  ? COVID-19 Vaccine (1) Never done  ? HIV Screening  Never done  ? Hepatitis C Screening  Never done  ? PAP SMEAR-Modifier  Never done  ? INFLUENZA VACCINE  09/25/2020  ? ?Lung Cancer Screening: (Low Dose CT Chest recommended if Age 46-80 years, 30 pack-year currently smoking OR  have quit w/in 15years.) does not qualify.  ? ?Lung Cancer Screening Referral: n/a ? ?Additional Screening: ? ?Hepatitis C Screening: does qualify; Completed n/a ? ?Vision Screening: Recommended annual ophthalmology exams for early detection of glaucoma and other disorders of the eye. ?Is the patient up to date with their annual eye exam?  Yes  ?Who is the provider or what is the name of the office in which the patient attends annual eye exams? [facility] ?If pt is not established with a provider, would they like to be referred to a provider to establish care? No .  ? ?Dental Screening: Recommended annual dental exams for proper oral hygiene ? ?Community Resource Referral / Chronic Care Management: ?CRR required this visit?  No  ? ?CCM required this visit?  No  ? ? ?  ?Plan:  ?  ? ?I have personally reviewed and noted the following in the patient?s chart:  ? ?Medical and social history ?Use of alcohol, tobacco or illicit drugs  ?Current medications and supplements including opioid prescriptions. Patient is not currently taking opioid prescriptions. ?Functional ability and status ?Nutritional status ?Physical activity ?Advanced directives ?List of other physicians ?Hospitalizations, surgeries, and ER visits in previous 12 months ?Vitals ?Screenings to include cognitive, depression, and  falls ?Referrals and appointments ? ?In addition, I have reviewed and discussed with patient certain preventive protocols, quality metrics, and best practice recommendations. A written personalized care plan for pr

## 2021-05-11 NOTE — Patient Instructions (Signed)

## 2021-08-09 ENCOUNTER — Ambulatory Visit: Payer: 59 | Admitting: Family Medicine

## 2021-09-18 ENCOUNTER — Ambulatory Visit: Payer: 59 | Admitting: Family Medicine

## 2021-10-16 ENCOUNTER — Ambulatory Visit: Payer: 59 | Admitting: Family Medicine

## 2021-10-17 ENCOUNTER — Ambulatory Visit: Payer: 59 | Admitting: Family Medicine

## 2022-06-25 ENCOUNTER — Telehealth: Payer: Self-pay | Admitting: Family Medicine

## 2022-06-25 NOTE — Telephone Encounter (Signed)
Called patient to schedule Medicare Annual Wellness Visit (AWV). Left message for patient to call back and schedule Medicare Annual Wellness Visit (AWV).  Last date of AWV: 05/11/21  If any questions, please contact me at [phone].  Thank you ,  Rudell Cobb AWV direct phone # [phone]

## 2022-07-11 ENCOUNTER — Telehealth: Payer: Self-pay | Admitting: Family Medicine

## 2022-07-11 NOTE — Telephone Encounter (Signed)
Contacted Samantha Sutton to schedule their annual wellness visit. Appointment made for 07/17/22.  Samantha Sutton AWV direct phone # [phone]

## 2022-07-17 ENCOUNTER — Ambulatory Visit (INDEPENDENT_AMBULATORY_CARE_PROVIDER_SITE_OTHER): Payer: 59

## 2022-07-17 VITALS — Ht 60.0 in | Wt 110.0 lb

## 2022-07-17 DIAGNOSIS — Z0000 Encounter for general adult medical examination without abnormal findings: Secondary | ICD-10-CM

## 2022-07-17 NOTE — Progress Notes (Signed)
I connected with  Samantha Sutton on 07/17/22 by a audio enabled telemedicine application and verified that I am speaking with the correct person using two identifiers.  Patient Location: Home  Provider Location: Office/Clinic  I discussed the limitations of evaluation and management by telemedicine. The patient expressed understanding and agreed to proceed.  Subjective:   Samantha Sutton is a 42 y.o. female who presents for an Initial Medicare Annual Wellness Visit.  Review of Systems     Cardiac Risk Factors include: none     Objective:    Today's Vitals   07/17/22 1626 07/17/22 1627  Weight: 110 lb (49.9 kg)   Height: 5' (1.524 m)   PainSc:  6    Body mass index is 21.48 kg/m.     07/17/2022    4:31 PM 11/20/2018   12:05 PM 02/15/2018    3:16 PM 06/28/2014    6:21 PM  Advanced Directives  Does Patient Have a Medical Advance Directive? No No No No  Would patient like information on creating a medical advance directive?   No - Patient declined No - patient declined information    Current Medications (verified) No outpatient encounter medications on file as of 07/17/2022.   No facility-administered encounter medications on file as of 07/17/2022.    Allergies (verified) Vicodin [hydrocodone-acetaminophen] and Hydrocodone-acetaminophen   History: Past Medical History:  Diagnosis Date   Cerebral palsy (HCC)    Past Surgical History:  Procedure Laterality Date   LEG SURGERY     Family History  Problem Relation Age of Onset   Diabetes Mother    High Cholesterol Mother    Hypertension Mother    Diabetes Maternal Grandmother    Hypertension Maternal Grandmother    High Cholesterol Maternal Grandmother    Diabetes Paternal Grandmother    High Cholesterol Paternal Grandmother    Social History   Socioeconomic History   Marital status: Single    Spouse name: Not on file   Number of children: Not on file   Years of education: Not on file   Highest education  level: Not on file  Occupational History   Not on file  Tobacco Use   Smoking status: Former   Smokeless tobacco: Never  Vaping Use   Vaping Use: Never used  Substance and Sexual Activity   Alcohol use: No   Drug use: Yes    Types: Marijuana   Sexual activity: Not on file  Other Topics Concern   Not on file  Social History Narrative   Not on file   Social Determinants of Health   Financial Resource Strain: Low Risk  (07/17/2022)   Overall Financial Resource Strain (CARDIA)    Difficulty of Paying Living Expenses: Not hard at all  Food Insecurity: No Food Insecurity (07/17/2022)   Hunger Vital Sign    Worried About Running Out of Food in the Last Year: Never true    Ran Out of Food in the Last Year: Never true  Transportation Needs: No Transportation Needs (07/17/2022)   PRAPARE - Administrator, Civil Service (Medical): No    Lack of Transportation (Non-Medical): No  Physical Activity: Inactive (07/17/2022)   Exercise Vital Sign    Days of Exercise per Week: 0 days    Minutes of Exercise per Session: 0 min  Stress: No Stress Concern Present (07/17/2022)   [employer] of Occupational Health - Occupational Stress Questionnaire    Feeling of Stress : Not at all  Social Connections:  Socially Isolated (05/11/2021)   Social Connection and Isolation Panel [NHANES]    Frequency of Communication with Friends and Family: More than three times a week    Frequency of Social Gatherings with Friends and Family: Twice a week    Attends Religious Services: Never    Database administrator or Organizations: No    Attends Engineer, structural: Never    Marital Status: Never married    Tobacco Counseling Counseling given: Not Answered   Clinical Intake:  Pre-visit preparation completed: Yes  Pain : 0-10 Pain Score: 6  Pain Type: Acute pain Pain Location: Hip (knees) Pain Orientation: Left, Right Pain Descriptors / Indicators: Aching Pain Onset: More than  a month ago Pain Frequency: Constant     Nutritional Status: BMI of 19-24  Normal Nutritional Risks: None Diabetes: No  How often do you need to have someone help you when you read instructions, pamphlets, or other written materials from your doctor or pharmacy?: 1 - Never  Diabetic? no  Interpreter Needed?: No  Information entered by :: NAllen LPN   Activities of Daily Living    07/17/2022    4:33 PM  In your present state of health, do you have any difficulty performing the following activities:  Hearing? 0  Vision? 0  Difficulty concentrating or making decisions? 0  Walking or climbing stairs? 1  Dressing or bathing? 0  Doing errands, shopping? 0  Preparing Food and eating ? N  Using the Toilet? N  In the past six months, have you accidently leaked urine? N  Do you have problems with loss of bowel control? N  Managing your Medications? N  Managing your Finances? N  Housekeeping or managing your Housekeeping? N    Patient Care Team: Georganna Skeans, MD as PCP - General (Family Medicine) Ludger Nutting, MD as Referring Physician (Podiatry)  Indicate any recent Medical Services you may have received from other than [facility] providers in the past year (date may be approximate).     Assessment:   This is a routine wellness examination for [name].  Hearing/Vision screen Vision Screening - Comments:: No regular eye exams  Dietary issues and exercise activities discussed: Current Exercise Habits: The patient does not participate in regular exercise at present   Goals Addressed             This Visit's Progress    Patient Stated       07/17/2022, no goals       Depression Screen    07/17/2022    4:33 PM 05/11/2021   12:44 PM 10/10/2020    3:47 PM  PHQ 2/9 Scores  PHQ - 2 Score 0 0 3  PHQ- 9 Score   9    Fall Risk    07/17/2022    4:32 PM 05/11/2021   12:56 PM  Fall Risk   Falls in the past year? 1 1  Comment tripped, legs give out   Number falls in past  yr: 1 1  Injury with Fall? 0 1  Risk for fall due to : Impaired mobility;Impaired balance/gait History of fall(s)  Follow up Falls prevention discussed;Education provided;Falls evaluation completed Falls evaluation completed    FALL RISK PREVENTION PERTAINING TO THE HOME:  Any stairs in or around the home? Yes  If so, are there any without handrails? No  Home free of loose throw rugs in walkways, pet beds, electrical cords, etc? Yes  Adequate lighting in your home to reduce risk of  falls? Yes   ASSISTIVE DEVICES UTILIZED TO PREVENT FALLS:  Life alert? No  Use of a cane, walker or w/c? Yes  Grab bars in the bathroom? No  Shower chair or bench in shower? No  Elevated toilet seat or a handicapped toilet? No   TIMED UP AND GO:  Was the test performed? No .      Cognitive Function:        07/17/2022    4:33 PM 05/11/2021   12:54 PM  6CIT Screen  What Year? 0 points 0 points  What month? 0 points 0 points  What time? 0 points 0 points  Count back from 20 0 points 0 points  Months in reverse 0 points 0 points  Repeat phrase 0 points 0 points  Total Score 0 points 0 points    Immunizations Immunization History  Administered Date(s) Administered   PPD Test 10/04/2013    TDAP status: Due, Education has been provided regarding the importance of this vaccine. Advised may receive this vaccine at local pharmacy or Health Dept. Aware to provide a copy of the vaccination record if obtained from local pharmacy or Health Dept. Verbalized acceptance and understanding.  Flu Vaccine status: Up to date  Pneumococcal vaccine status: Up to date  Covid-19 vaccine status: Declined, Education has been provided regarding the importance of this vaccine but patient still declined. Advised may receive this vaccine at local pharmacy or Health Dept.or vaccine clinic. Aware to provide a copy of the vaccination record if obtained from local pharmacy or Health Dept. Verbalized acceptance and  understanding.  Qualifies for Shingles Vaccine? No   Zostavax completed  n/a   Shingrix Completed?: n/a  Screening Tests Health Maintenance  Topic Date Due   COVID-19 Vaccine (1) Never done   HIV Screening  Never done   Hepatitis C Screening  Never done   DTaP/Tdap/Td (1 - Tdap) Never done   PAP SMEAR-Modifier  Never done   Medicare Annual Wellness (AWV)  05/12/2022   INFLUENZA VACCINE  09/26/2022   HPV VACCINES  Aged Out    Health Maintenance  Health Maintenance Due  Topic Date Due   COVID-19 Vaccine (1) Never done   HIV Screening  Never done   Hepatitis C Screening  Never done   DTaP/Tdap/Td (1 - Tdap) Never done   PAP SMEAR-Modifier  Never done   Medicare Annual Wellness (AWV)  05/12/2022    Colorectal cancer screening: n/a  Mammogram status: n/a  Bone Density status: n/a  Lung Cancer Screening: (Low Dose CT Chest recommended if Age 15-80 years, 30 pack-year currently smoking OR have quit w/in 15years.) does not qualify.   Lung Cancer Screening Referral: no  Additional Screening:  Hepatitis C Screening: does qualify;   Vision Screening: Recommended annual ophthalmology exams for early detection of glaucoma and other disorders of the eye. Is the patient up to date with their annual eye exam?  No  Who is the provider or what is the name of the office in which the patient attends annual eye exams? none If pt is not established with a provider, would they like to be referred to a provider to establish care? No .   Dental Screening: Recommended annual dental exams for proper oral hygiene  Community Resource Referral / Chronic Care Management: CRR required this visit?  No   CCM required this visit?  No      Plan:     I have personally reviewed and noted the following in the  patient's chart:   Medical and social history Use of alcohol, tobacco or illicit drugs  Current medications and supplements including opioid prescriptions. Patient is not currently  taking opioid prescriptions. Functional ability and status Nutritional status Physical activity Advanced directives List of other physicians Hospitalizations, surgeries, and ER visits in previous 12 months Vitals Screenings to include cognitive, depression, and falls Referrals and appointments  In addition, I have reviewed and discussed with patient certain preventive protocols, quality metrics, and best practice recommendations. A written personalized care plan for preventive services as well as general preventive health recommendations were provided to patient.     Barb Merino, LPN   [id number]   Nurse Notes: none  Due to this being a virtual visit, the after visit summary with patients personalized plan was offered to patient via mail or my-chart.  to pick up at office at next visit

## 2022-07-17 NOTE — Patient Instructions (Addendum)
Samantha Sutton , Thank you for taking time to come for your Medicare Wellness Visit. I appreciate your ongoing commitment to your health goals. Please review the following plan we discussed and let me know if I can assist you in the future.   These are the goals we discussed:  Goals      Patient Stated     07/17/2022, no goals        This is a list of the screening recommended for you and due dates:  Health Maintenance  Topic Date Due   COVID-19 Vaccine (1) Never done   HIV Screening  Never done   Hepatitis C Screening: USPSTF Recommendation to screen - Ages 65-79 yo.  Never done   DTaP/Tdap/Td vaccine (1 - Tdap) Never done   Pap Smear  Never done   Flu Shot  09/26/2022   Medicare Annual Wellness Visit  07/17/2023   HPV Vaccine  Aged Out    Advanced directives: Advance directive discussed with you today.   Conditions/risks identified: none  Next appointment: Follow up in one year for your annual wellness visit.   Preventive Care 90-40 Years Old, Female Preventive care refers to lifestyle choices and visits with your health care provider that can promote health and wellness. Preventive care visits are also called wellness exams. What can I expect for my preventive care visit? Counseling During your preventive care visit, your health care provider may ask about your: Medical history, including: Past medical problems. Family medical history. Pregnancy history. Current health, including: Menstrual cycle. Method of birth control. Emotional well-being. Home life and relationship well-being. Sexual activity and sexual health. Lifestyle, including: Alcohol, nicotine or tobacco, and drug use. Access to firearms. Diet, exercise, and sleep habits. Work and work Astronomer. Sunscreen use. Safety issues such as seatbelt and bike helmet use. Physical exam Your health care provider may check your: Height and weight. These may be used to calculate your BMI (body mass index). BMI  is a measurement that tells if you are at a healthy weight. Waist circumference. This measures the distance around your waistline. This measurement also tells if you are at a healthy weight and may help predict your risk of certain diseases, such as type 2 diabetes and high blood pressure. Heart rate and blood pressure. Body temperature. Skin for abnormal spots. What immunizations do I need? Vaccines are usually given at various ages, according to a schedule. Your health care provider will recommend vaccines for you based on your age, medical history, and lifestyle or other factors, such as travel or where you work. What tests do I need? Screening Your health care provider may recommend screening tests for certain conditions. This may include: Pelvic exam and Pap test. Lipid and cholesterol levels. Diabetes screening. This is done by checking your blood sugar (glucose) after you have not eaten for a while (fasting). Hepatitis B test. Hepatitis C test. HIV (human immunodeficiency virus) test. STI (sexually transmitted infection) testing, if you are at risk. BRCA-related cancer screening. This may be done if you have a family history of breast, ovarian, tubal, or peritoneal cancers. Talk with your health care provider about your test results, treatment options, and if necessary, the need for more tests. Follow these instructions at home: Eating and drinking  Eat a healthy diet that includes fresh fruits and vegetables, whole grains, lean protein, and low-fat dairy products. Take vitamin and mineral supplements as recommended by your health care provider. Do not drink alcohol if: Your health care provider tells  you not to drink. You are pregnant, may be pregnant, or are planning to become pregnant. If you drink alcohol: Limit how much you have to 0-1 drink a day. Know how much alcohol is in your drink. In the [place] one drink equals one 12 oz bottle of beer (355 mL), one 5 oz glass of wine  (148 mL), or one 1 oz glass of hard liquor (44 mL). Lifestyle Brush your teeth every morning and night with fluoride toothpaste. Floss one time each day. Exercise for at least 30 minutes 5 or more days each week. Do not use any products that contain nicotine or tobacco. These products include cigarettes, chewing tobacco, and vaping devices, such as e-cigarettes. If you need help quitting, ask your health care provider. Do not use drugs. If you are sexually active, practice safe sex. Use a condom or other form of protection to prevent STIs. If you do not wish to become pregnant, use a form of birth control. If you plan to become pregnant, see your health care provider for a prepregnancy visit. Find healthy ways to manage stress, such as: Meditation, yoga, or listening to music. Journaling. Talking to a trusted person. Spending time with friends and family. Minimize exposure to UV radiation to reduce your risk of skin cancer. Safety Always wear your seat belt while driving or riding in a vehicle. Do not drive: If you have been drinking alcohol. Do not ride with someone who has been drinking. If you have been using any mind-altering substances or drugs. While texting. When you are tired or distracted. Wear a helmet and other protective equipment during sports activities. If you have firearms in your house, make sure you follow all gun safety procedures. Seek help if you have been physically or sexually abused. What's next? Go to your health care provider once a year for an annual wellness visit. Ask your health care provider how often you should have your eyes and teeth checked. Stay up to date on all vaccines. This information is not intended to replace advice given to you by your health care provider. Make sure you discuss any questions you have with your health care provider. Document Revised: 08/09/2020 Document Reviewed: 08/09/2020 Elsevier Patient Education  2022 [employer].

## 2022-08-15 ENCOUNTER — Ambulatory Visit: Payer: 59 | Admitting: Family Medicine

## 2022-10-11 ENCOUNTER — Emergency Department (HOSPITAL_COMMUNITY)
Admission: EM | Admit: 2022-10-11 | Discharge: 2022-10-12 | Disposition: A | Discharge status: Home / Self Care | Payer: 59 | Attending: Emergency Medicine | Admitting: Emergency Medicine

## 2022-10-11 DIAGNOSIS — M545 Low back pain, unspecified: Secondary | ICD-10-CM | POA: Insufficient documentation

## 2022-10-11 DIAGNOSIS — Y92410 Unspecified street and highway as the place of occurrence of the external cause: Secondary | ICD-10-CM | POA: Insufficient documentation

## 2022-10-11 DIAGNOSIS — Z87891 Personal history of nicotine dependence: Secondary | ICD-10-CM | POA: Diagnosis not present

## 2022-10-11 DIAGNOSIS — M549 Dorsalgia, unspecified: Secondary | ICD-10-CM

## 2022-10-12 ENCOUNTER — Emergency Department (HOSPITAL_COMMUNITY): Payer: 59

## 2022-10-12 ENCOUNTER — Other Ambulatory Visit: Payer: Self-pay

## 2022-10-12 ENCOUNTER — Other Ambulatory Visit (HOSPITAL_BASED_OUTPATIENT_CLINIC_OR_DEPARTMENT_OTHER): Payer: Self-pay

## 2022-10-12 ENCOUNTER — Encounter (HOSPITAL_COMMUNITY): Payer: Self-pay | Admitting: *Deleted

## 2022-10-12 DIAGNOSIS — M545 Low back pain, unspecified: Secondary | ICD-10-CM | POA: Diagnosis not present

## 2022-10-12 MED ORDER — METHOCARBAMOL 500 MG PO TABS
500.0000 mg | ORAL_TABLET | Freq: Three times a day (TID) | ORAL | 0 refills | Status: AC | PRN
  Filled 2022-10-12 – 2023-02-12 (×2): qty 30, 10d supply, fill #0

## 2022-10-12 MED ORDER — NAPROXEN 500 MG PO TABS
500.0000 mg | ORAL_TABLET | Freq: Two times a day (BID) | ORAL | 0 refills | Status: AC
  Filled 2022-10-12 – 2023-02-12 (×2): qty 30, 15d supply, fill #0
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
## 2022-10-12 NOTE — Discharge Instructions (Signed)
You were evaluated in the Emergency Department and after careful evaluation, we did not find any emergent condition requiring admission or further testing in the hospital.  Your exam/testing today is overall reassuring.  X-rays without any broken bones.  Suspect muscle strain or spasm.  Recommend using the Naprosyn twice daily as prescribed for pain.  Can use the Robaxin muscle relaxer for more significant pain, best used at night if you are having trouble sleeping as it can cause drowsiness.  Please return to the Emergency Department if you experience any worsening of your condition.   Thank you for allowing [place] to be a part of your care.
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
## 2022-10-14 ENCOUNTER — Other Ambulatory Visit (HOSPITAL_BASED_OUTPATIENT_CLINIC_OR_DEPARTMENT_OTHER): Payer: Self-pay

## 2022-11-13 ENCOUNTER — Ambulatory Visit (HOSPITAL_COMMUNITY)
Admission: EM | Admit: 2022-11-13 | Discharge: 2022-11-13 | Disposition: A | Discharge status: Home / Self Care | Payer: 59 | Attending: Family Medicine | Admitting: Family Medicine

## 2022-11-13 ENCOUNTER — Ambulatory Visit (HOSPITAL_COMMUNITY): Payer: Self-pay

## 2022-11-13 ENCOUNTER — Encounter (HOSPITAL_COMMUNITY): Payer: Self-pay | Admitting: Emergency Medicine

## 2022-11-13 DIAGNOSIS — N760 Acute vaginitis: Secondary | ICD-10-CM | POA: Insufficient documentation

## 2022-11-13 DIAGNOSIS — Z113 Encounter for screening for infections with a predominantly sexual mode of transmission: Secondary | ICD-10-CM | POA: Diagnosis present

## 2022-11-13 LAB — POCT URINALYSIS DIP (MANUAL ENTRY)
Bilirubin, UA: NEGATIVE
Blood, UA: NEGATIVE
Glucose, UA: NEGATIVE mg/dL
Ketones, POC UA: NEGATIVE mg/dL
Leukocytes, UA: NEGATIVE
Nitrite, UA: NEGATIVE
Protein Ur, POC: NEGATIVE mg/dL
Spec Grav, UA: >=1.03 — AB (ref 1.010–1.025)
Urobilinogen, UA: 0.2 U/dL
pH, UA: 5.5 (ref 5.0–8.0)

## 2022-11-13 LAB — HIV ANTIBODY (ROUTINE TESTING W REFLEX): HIV Screen 4th Generation wRfx: NONREACTIVE

## 2022-11-13 LAB — POCT URINE PREGNANCY: Preg Test, Ur: NEGATIVE

## 2022-11-13 MED ORDER — FLUCONAZOLE 150 MG PO TABS: 150.0000 mg | ORAL_TABLET | ORAL | 0 refills | Status: DC | PRN

## 2022-11-13 NOTE — ED Provider Notes (Signed)
[facility]    CSN: [id number] Arrival date & time: 11/13/22  1821      History   Chief Complaint Chief Complaint  Patient presents with   Appointment    Appointment    SEXUALLY TRANSMITTED DISEASE    HPI Samantha Sutton is a 42 y.o. female.   HPI Patient presents today for STI testing.  Patient reports 1 week history of vaginal irritation and abnormal vaginal discharge.  She has not attempted relief with any over-the-counter medications.  On chart review patient has a history of bacterial vaginosis.  Last menstrual period was 10/31/2022.  She is not having any urinary symptoms.  Past Medical History:  Diagnosis Date   Cerebral palsy [facility])     Patient Active Problem List   Diagnosis Date Noted   Cerebral palsy (HCC) 05/10/2021   Chronic pain of both knees 05/10/2021    Past Surgical History:  Procedure Laterality Date   LEG SURGERY      OB History     Gravida  3   Para      Term      Preterm      AB      Living         SAB      IAB      Ectopic      Multiple      Live Births               Home Medications    Prior to Admission medications   Medication Sig Start Date End Date Taking? Authorizing Provider  fluconazole (DIFLUCAN) 150 MG tablet Take 1 tablet (150 mg total) by mouth every three (3) days as needed. Repeat if needed 11/13/22  Yes Bing Neighbors, NP  methocarbamol (ROBAXIN) 500 MG tablet Take 1 tablet (500 mg total) by mouth every 8 (eight) hours as needed for muscle spasms. Patient not taking: Reported on 11/13/2022 10/12/22   Sabas Sous, MD  naproxen (NAPROSYN) 500 MG tablet Take 1 tablet (500 mg total) by mouth 2 (two) times daily. Patient not taking: Reported on 11/13/2022 10/12/22   Sabas Sous, MD    Family History Family History  Problem Relation Age of Onset   Diabetes Mother    High Cholesterol Mother    Hypertension Mother    Diabetes Maternal Grandmother    Hypertension Maternal Grandmother     High Cholesterol Maternal Grandmother    Diabetes Paternal Grandmother    High Cholesterol Paternal Grandmother     Social History Social History   Tobacco Use   Smoking status: Former   Smokeless tobacco: Never  Vaping Use   Vaping status: Never Used  Substance Use Topics   Alcohol use: Yes   Drug use: Yes    Types: Marijuana     Allergies   Vicodin [hydrocodone-acetaminophen] and Hydrocodone-acetaminophen   Review of Systems Review of Systems Pertinent negatives listed in HPI   Physical Exam Triage Vital Signs ED Triage Vitals  Encounter Vitals Group     BP 11/13/22 1901 106/71     Systolic BP Percentile --      Diastolic BP Percentile --      Pulse Rate 11/13/22 1858 78     Resp 11/13/22 1858 16     Temp 11/13/22 1858 98.7 F (37.1 C)     Temp Source 11/13/22 1858 Oral     SpO2 11/13/22 1858 98 %     Weight --  Height --      Head Circumference --      Peak Flow --      Pain Score 11/13/22 1858 0     Pain Loc --      Pain Education --      Exclude from Growth Chart --    No data found.  Updated Vital Signs BP 106/71 (BP Location: Right Arm)   Pulse 78   Temp 98.7 F (37.1 C) (Oral)   Resp 16   LMP 10/31/2022 (Exact Date)   SpO2 98%   Visual Acuity Right Eye Distance:   Left Eye Distance:   Bilateral Distance:    Right Eye Near:   Left Eye Near:    Bilateral Near:     Physical Exam General appearance: Alert, well developed, well nourished, cooperative Head: Normocephalic, without obvious abnormality, atraumatic Respiratory: Respirations even and unlabored, normal respiratory rate Heart: Rate and rhythm normal.   CVA:  No flank pain Skin: Skin color, texture, turgor normal. No rashes seen  Psych: Appropriate mood and affect.  Vaginal cytology self collected UC Treatments / Results  Labs (all labs ordered are listed, but only abnormal results are displayed) Labs Reviewed  POCT URINALYSIS DIP (MANUAL ENTRY) - Abnormal; Notable  for the following components:      Result Value   Spec Grav, UA >=1.030 (*)    All other components within normal limits  HIV ANTIBODY (ROUTINE TESTING W REFLEX)  RPR  POCT URINE PREGNANCY  CERVICOVAGINAL ANCILLARY ONLY    EKG   Radiology No results found.  Procedures Procedures (including critical care time)  Medications Ordered in UC Medications - No data to display  Initial Impression / Assessment and Plan / UC Course  I have reviewed the triage vital signs and the nursing notes.  Pertinent labs & imaging results that were available during my care of the patient were reviewed by me and considered in my medical decision making (see chart for details).    Patient here today for STD screening due to vaginitis symptoms.  HIV and RPR testing pending.  Patient notified that all results will update to MyChart and she will be contacted if any of her results are abnormal.   Treating for vaginitis while awaiting cytology results. Final Clinical Impressions(s) / UC Diagnoses   Final diagnoses:  Vaginitis and vulvovaginitis  Screening for STD (sexually transmitted disease)     Discharge Instructions      Your lab work and cytology results will be available within 24 hours. I am treating you for suspected yeast infection causing vaginal itching.  If any additional treatment is needed our office will reach out to you via MyChart or by phone we will prescribe any medication warranted after review of your results.  Your urine results did not show infection and urine pregnancy is negative.     ED Prescriptions     Medication Sig Dispense Auth. Provider   fluconazole (DIFLUCAN) 150 MG tablet Take 1 tablet (150 mg total) by mouth every three (3) days as needed. Repeat if needed 2 tablet Bing Neighbors, NP      PDMP not reviewed this encounter.   Bing Neighbors, NP 11/13/22 1942

## 2022-11-13 NOTE — Discharge Instructions (Addendum)
Your lab work and cytology results will be available within 24 hours. I am treating you for suspected yeast infection causing vaginal itching.  If any additional treatment is needed our office will reach out to you via MyChart or by phone we will prescribe any medication warranted after review of your results.  Your urine results did not show infection and urine pregnancy is negative.

## 2022-11-13 NOTE — ED Triage Notes (Signed)
Pt has burning sensation and vaginal discharge that began about a week ago.  Pt denies taking any medication or topicals.

## 2022-11-14 LAB — RPR: RPR Ser Ql: NONREACTIVE

## 2022-11-15 ENCOUNTER — Telehealth: Payer: Self-pay | Admitting: Emergency Medicine

## 2022-11-15 LAB — CERVICOVAGINAL ANCILLARY ONLY
Bacterial Vaginitis (gardnerella): POSITIVE — AB
Candida Glabrata: NEGATIVE
Candida Vaginitis: POSITIVE — AB
Chlamydia: NEGATIVE
Comment: NEGATIVE
Comment: NEGATIVE
Comment: NEGATIVE
Comment: NEGATIVE
Comment: NEGATIVE
Comment: NORMAL
Neisseria Gonorrhea: NEGATIVE
Trichomonas: POSITIVE — AB

## 2022-11-15 MED ORDER — METRONIDAZOLE 500 MG PO TABS: 500.0000 mg | ORAL_TABLET | Freq: Two times a day (BID) | ORAL | 0 refills | Status: DC

## 2022-11-15 NOTE — Telephone Encounter (Signed)
Metronidazole for positive BV and Trichomonas

## 2023-02-12 ENCOUNTER — Other Ambulatory Visit: Payer: Self-pay

## 2023-03-11 ENCOUNTER — Ambulatory Visit (HOSPITAL_COMMUNITY)
Admission: EM | Admit: 2023-03-11 | Discharge: 2023-03-11 | Disposition: A | Discharge status: Home / Self Care | Payer: 59 | Attending: Emergency Medicine | Admitting: Emergency Medicine

## 2023-03-11 ENCOUNTER — Encounter (HOSPITAL_COMMUNITY): Payer: Self-pay

## 2023-03-11 DIAGNOSIS — M25562 Pain in left knee: Secondary | ICD-10-CM

## 2023-03-11 DIAGNOSIS — M25561 Pain in right knee: Secondary | ICD-10-CM

## 2023-03-11 DIAGNOSIS — G8929 Other chronic pain: Secondary | ICD-10-CM

## 2023-03-11 DIAGNOSIS — M25552 Pain in left hip: Secondary | ICD-10-CM | POA: Diagnosis not present

## 2023-03-11 MED ORDER — KETOROLAC TROMETHAMINE 30 MG/ML IJ SOLN
INTRAMUSCULAR | Status: AC
  Filled 2023-03-11: qty 1

## 2023-03-11 MED ORDER — KETOROLAC TROMETHAMINE 30 MG/ML IJ SOLN
30.0000 mg | Freq: Once | INTRAMUSCULAR | Status: AC
  Administered 2023-03-11: 30 mg via INTRAMUSCULAR

## 2023-03-11 MED ORDER — PREDNISONE 20 MG PO TABS: 40.0000 mg | ORAL_TABLET | Freq: Every day | ORAL | 0 refills | Status: AC

## 2023-03-11 NOTE — ED Provider Notes (Signed)
 [facility]    CSN: [id number] Arrival date & time: 03/11/23  1826      History   Chief Complaint Chief Complaint  Patient presents with   Joint Pain    Left hip and knees    HPI Samantha Sutton is a 43 y.o. female.   Patient presents with chronic bilateral knee and left hip pain that began to worsen about a week ago.  Patient reports taking naproxen  and Robaxin  without relief.  History of cerebral palsy.     Past Medical History:  Diagnosis Date   Cerebral palsy [facility])     Patient Active Problem List   Diagnosis Date Noted   Cerebral palsy (HCC) 05/10/2021   Chronic pain of both knees 05/10/2021    Past Surgical History:  Procedure Laterality Date   LEG SURGERY      OB History     Gravida  3   Para      Term      Preterm      AB      Living         SAB      IAB      Ectopic      Multiple      Live Births               Home Medications    Prior to Admission medications   Medication Sig Start Date End Date Taking? Authorizing Provider  methocarbamol  (ROBAXIN ) 500 MG tablet Take 1 tablet (500 mg total) by mouth every 8 (eight) hours as needed for muscle spasms. 10/12/22  Yes Theadore Ozell HERO, MD  naproxen  (NAPROSYN ) 500 MG tablet Take 1 tablet (500 mg total) by mouth 2 (two) times daily. 10/12/22  Yes Theadore Ozell HERO, MD  predniSONE  (DELTASONE ) 20 MG tablet Take 2 tablets (40 mg total) by mouth daily for 5 days. 03/11/23 03/16/23 Yes Johnie Rumaldo LABOR, NP    Family History Family History  Problem Relation Age of Onset   Diabetes Mother    High Cholesterol Mother    Hypertension Mother    Diabetes Maternal Grandmother    Hypertension Maternal Grandmother    High Cholesterol Maternal Grandmother    Diabetes Paternal Grandmother    High Cholesterol Paternal Grandmother     Social History Social History   Tobacco Use   Smoking status: Former   Smokeless tobacco: Never  Advertising Account Planner   Vaping status: Never Used   Substance Use Topics   Alcohol use: Yes   Drug use: Yes    Types: Marijuana     Allergies   Vicodin [hydrocodone-acetaminophen ] and Hydrocodone-acetaminophen    Review of Systems Review of Systems  Constitutional:  Negative for fever.  Musculoskeletal:  Positive for arthralgias.     Physical Exam Triage Vital Signs ED Triage Vitals  Encounter Vitals Group     BP 03/11/23 1946 118/69     Systolic BP Percentile --      Diastolic BP Percentile --      Pulse Rate 03/11/23 1946 65     Resp 03/11/23 1946 16     Temp 03/11/23 1946 97.8 F (36.6 C)     Temp Source 03/11/23 1946 Oral     SpO2 03/11/23 1946 98 %     Weight 03/11/23 1946 110 lb (49.9 kg)     Height 03/11/23 1946 5' (1.524 m)     Head Circumference --      Peak Flow --  Pain Score 03/11/23 1945 8     Pain Loc --      Pain Education --      Exclude from Growth Chart --    No data found.  Updated Vital Signs BP 118/69 (BP Location: Right Arm)   Pulse 65   Temp 97.8 F (36.6 C) (Oral)   Resp 16   Ht 5' (1.524 m)   Wt 110 lb (49.9 kg)   LMP 03/08/2023 (Exact Date)   SpO2 98%   Breastfeeding No   BMI 21.48 kg/m   Visual Acuity Right Eye Distance:   Left Eye Distance:   Bilateral Distance:    Right Eye Near:   Left Eye Near:    Bilateral Near:     Physical Exam Vitals and nursing note reviewed.  Constitutional:      General: She is awake. She is not in acute distress.    Appearance: Normal appearance. She is well-developed and well-groomed.  Musculoskeletal:     Right hip: Normal.     Left hip: Normal.     Right knee: Tenderness present.     Left knee: Swelling present. Tenderness present.     Comments: Mild tenderness to bilateral knees upon palpation.  Mild swelling noted to left knee.  Neurological:     Mental Status: She is alert.  Psychiatric:        Behavior: Behavior is cooperative.      UC Treatments / Results  Labs (all labs ordered are listed, but only abnormal  results are displayed) Labs Reviewed - No data to display  EKG   Radiology No results found.  Procedures Procedures (including critical care time)  Medications Ordered in UC Medications  ketorolac  (TORADOL ) 30 MG/ML injection 30 mg (30 mg Intramuscular Given 03/11/23 2014)    Initial Impression / Assessment and Plan / UC Course  I have reviewed the triage vital signs and the nursing notes.  Pertinent labs & imaging results that were available during my care of the patient were reviewed by me and considered in my medical decision making (see chart for details).     Patient presented with chronic bilateral knee and left hip pain that began to worsen about a week ago.  Upon assessment patient has mild tenderness to bilateral knees upon palpation and mild swelling to left knee.  Patient is ambulatory.  Given Toradol  injection in clinic to help with inflammation and pain.  Prescribed short course of steroids to help with inflammation.  Discussed follow-up and return precautions. Final Clinical Impressions(s) / UC Diagnoses   Final diagnoses:  Chronic pain of both knees  Pain of left hip     Discharge Instructions      You were given a Toradol  shot in clinic today to help with your pain and decrease inflammation.  I have prescribed prednisone  to take over the next few days to help with inflammation.  Attached is [facility] that you can follow-up with for chronic pain.  Return here as needed.    ED Prescriptions     Medication Sig Dispense Auth. Provider   predniSONE  (DELTASONE ) 20 MG tablet Take 2 tablets (40 mg total) by mouth daily for 5 days. 10 tablet Johnie Flaming A, NP      PDMP not reviewed this encounter.   Johnie Flaming A, NP 03/11/23 2049

## 2023-03-11 NOTE — Discharge Instructions (Signed)
 You were given a Toradol  shot in clinic today to help with your pain and decrease inflammation.  I have prescribed prednisone  to take over the next few days to help with inflammation.  Attached is [facility] that you can follow-up with for chronic pain.  Return here as needed.

## 2023-03-11 NOTE — ED Triage Notes (Signed)
 Patient here today with c/o Left hip pain and bilat knee pain that has been worsening since last week. Patient has Cerebral Palsy and has a h/o leg surgery. Pins in her left hip. She has taken Naproxen and Methocarbamol with no relief.

## 2023-04-05 ENCOUNTER — Emergency Department (HOSPITAL_COMMUNITY)
Admission: EM | Admit: 2023-04-05 | Discharge: 2023-04-05 | Disposition: A | Discharge status: Home / Self Care | Payer: 59 | Attending: Emergency Medicine | Admitting: Emergency Medicine

## 2023-04-05 ENCOUNTER — Other Ambulatory Visit: Payer: Self-pay

## 2023-04-05 DIAGNOSIS — Z20822 Contact with and (suspected) exposure to covid-19: Secondary | ICD-10-CM | POA: Insufficient documentation

## 2023-04-05 DIAGNOSIS — J101 Influenza due to other identified influenza virus with other respiratory manifestations: Secondary | ICD-10-CM | POA: Diagnosis not present

## 2023-04-05 DIAGNOSIS — M791 Myalgia, unspecified site: Secondary | ICD-10-CM | POA: Diagnosis present

## 2023-04-05 LAB — RESP PANEL BY RT-PCR (RSV, FLU A&B, COVID)  RVPGX2
Influenza A by PCR: POSITIVE — AB
Influenza B by PCR: NEGATIVE
Resp Syncytial Virus by PCR: NEGATIVE
SARS Coronavirus 2 by RT PCR: NEGATIVE

## 2023-04-05 MED ORDER — ONDANSETRON HCL 4 MG/2ML IJ SOLN
4.0000 mg | Freq: Once | INTRAMUSCULAR | Status: AC
  Administered 2023-04-05: 4 mg via INTRAVENOUS
  Filled 2023-04-05: qty 2

## 2023-04-05 MED ORDER — LACTATED RINGERS IV BOLUS
1000.0000 mL | Freq: Once | INTRAVENOUS | Status: AC
  Administered 2023-04-05: 1000 mL via INTRAVENOUS

## 2023-04-05 MED ORDER — LACTATED RINGERS IV BOLUS: 2000.0000 mL | Freq: Once | INTRAVENOUS | Status: DC

## 2023-04-05 MED ORDER — KETOROLAC TROMETHAMINE 15 MG/ML IJ SOLN
15.0000 mg | Freq: Once | INTRAMUSCULAR | Status: AC
  Administered 2023-04-05: 15 mg via INTRAVENOUS
  Filled 2023-04-05: qty 1

## 2023-04-05 NOTE — ED Provider Triage Note (Signed)
 Emergency Medicine Provider Triage Evaluation Note  Samantha Sutton , a 43 y.o. female  was evaluated in triage.  Pt complains of URI Sx x 3 days.  Review of Systems  Positive: Cough, congestion, fatigue, HA, body aches, chills, nausea Negative: Fever, SHOB, CP, V/D, abd pain  Physical Exam  BP 124/67 (BP Location: Right Arm)   Pulse 96   Temp 98.8 F (37.1 C) (Oral)   Resp 16   Ht 5' (1.524 m)   Wt 52.2 kg   LMP 03/27/2023 (Exact Date)   SpO2 100%   BMI 22.46 kg/m  Gen:   Awake, no distress   Resp:  Normal effort  MSK:   Moves extremities without difficulty  Other:    Medical Decision Making  Medically screening exam initiated at 2:52 PM.  Appropriate orders placed.  Samantha Sutton was informed that the remainder of the evaluation will be completed by another provider, this initial triage assessment does not replace that evaluation, and the importance of remaining in the ED until their evaluation is complete.  Labs ordered   Samantha Ileana SAILOR, PA-C 04/05/23 1453

## 2023-04-05 NOTE — Discharge Instructions (Signed)
 You have influenza A.  Take Tylenol  1000 mg every 8 hours, Motrin  600 mg every 6-8 hours.  Drink plenty of fluids.  This should run its course in the next couple days.  Return for any concerning symptoms.

## 2023-04-05 NOTE — ED Triage Notes (Signed)
 Pt to ED via [facility] from home c/o flu like symptoms x 3 days. Reports sick contacts in home (children) . Productive cough. Not taking OTC meds.  Last vs 122/70, HR 90, 99%ra, cbg 91.

## 2023-04-05 NOTE — ED Provider Notes (Signed)
 Samantha Sutton EMERGENCY DEPARTMENT AT [facility] Provider Note   CSN: [id number] Arrival date & time: 04/05/23  1421     History  Chief Complaint  Patient presents with   flu sx    Samantha Sutton is a 43 y.o. female.  43 year old female presents today for concern of URI symptoms including body aches, fever, chills, cough for the past 3 days.  She complains of weakness and fatigue.  Decreased p.o. intake over the past 3 days.  Denies chest pain, shortness of breath, nausea or vomiting.  The history is provided by the patient. No language interpreter was used.       Home Medications Prior to Admission medications   Medication Sig Start Date End Date Taking? Authorizing Provider  methocarbamol  (ROBAXIN ) 500 MG tablet Take 1 tablet (500 mg total) by mouth every 8 (eight) hours as needed for muscle spasms. 10/12/22   Samantha Ozell HERO, MD  naproxen  (NAPROSYN ) 500 MG tablet Take 1 tablet (500 mg total) by mouth 2 (two) times daily. 10/12/22   Samantha Ozell HERO, MD      Allergies    Vicodin [hydrocodone-acetaminophen ] and Hydrocodone-acetaminophen     Review of Systems   Review of Systems  Constitutional:  Positive for chills, fatigue and fever. Negative for activity change.  HENT:  Positive for congestion and sinus pressure. Negative for sore throat, trouble swallowing and voice change.   Eyes:  Negative for visual disturbance.  Respiratory:  Positive for cough. Negative for shortness of breath.   Cardiovascular:  Negative for chest pain.  Gastrointestinal:  Negative for abdominal pain, nausea and vomiting.  Neurological:  Negative for weakness and light-headedness.  All other systems reviewed and are negative.   Physical Exam Updated Vital Signs BP 124/67 (BP Location: Right Arm)   Pulse 96   Temp 98.8 F (37.1 C) (Oral)   Resp 16   Ht 5' (1.524 m)   Wt 52.2 kg   LMP 03/27/2023 (Exact Date)   SpO2 100%   BMI 22.46 kg/m  Physical Exam Vitals and nursing note  reviewed.  Constitutional:      General: She is not in acute distress.    Appearance: Normal appearance. She is not ill-appearing.  HENT:     Head: Normocephalic and atraumatic.     Nose: Nose normal.  Eyes:     Conjunctiva/sclera: Conjunctivae normal.  Cardiovascular:     Rate and Rhythm: Normal rate and regular rhythm.  Pulmonary:     Effort: Pulmonary effort is normal. No respiratory distress.  Musculoskeletal:        General: No deformity. Normal range of motion.     Cervical back: Normal range of motion.  Skin:    Findings: No rash.  Neurological:     Mental Status: She is alert.     ED Results / Procedures / Treatments   Labs (all labs ordered are listed, but only abnormal results are displayed) Labs Reviewed  RESP PANEL BY RT-PCR (RSV, FLU A&B, COVID)  RVPGX2 - Abnormal; Notable for the following components:      Result Value   Influenza A by PCR POSITIVE (*)    All other components within normal limits    EKG None  Radiology No results found.  Procedures Procedures    Medications Ordered in ED Medications  ketorolac  (TORADOL ) 15 MG/ML injection 15 mg (15 mg Intravenous Given 04/05/23 1554)  ondansetron  (ZOFRAN ) injection 4 mg (4 mg Intravenous Given 04/05/23 1553)  lactated ringers  bolus 1,000  mL (1,000 mLs Intravenous New Bag/Given 04/05/23 1558)    ED Course/ Medical Decision Making/ A&P                                 Medical Decision Making Risk Prescription drug management.   43 year old female presents today for concern of flulike illness.  Respiratory panel positive for flu.  Will give Zofran , Toradol , and fluid bolus.  She is otherwise well-appearing.  Without acute distress.  Hemodynamically stable.  Symptomatic management discussed.  Discharged in stable condition.  Return precaution discussed.   Final Clinical Impression(s) / ED Diagnoses Final diagnoses:  Influenza A    Rx / DC Orders ED Discharge Orders     None         Samantha Loge, PA-C 04/05/23 1658    Samantha Lamar BROCKS, MD 04/07/23 1332

## 2023-04-26 IMAGING — DX DG KNEE COMPLETE 4+V*L*
4 series · 4 of 4 positions shown · non-contrast
Comparison: None.

CLINICAL DATA: Pain.

EXAM:
LEFT KNEE - COMPLETE 4+ VIEW

[knee ap]
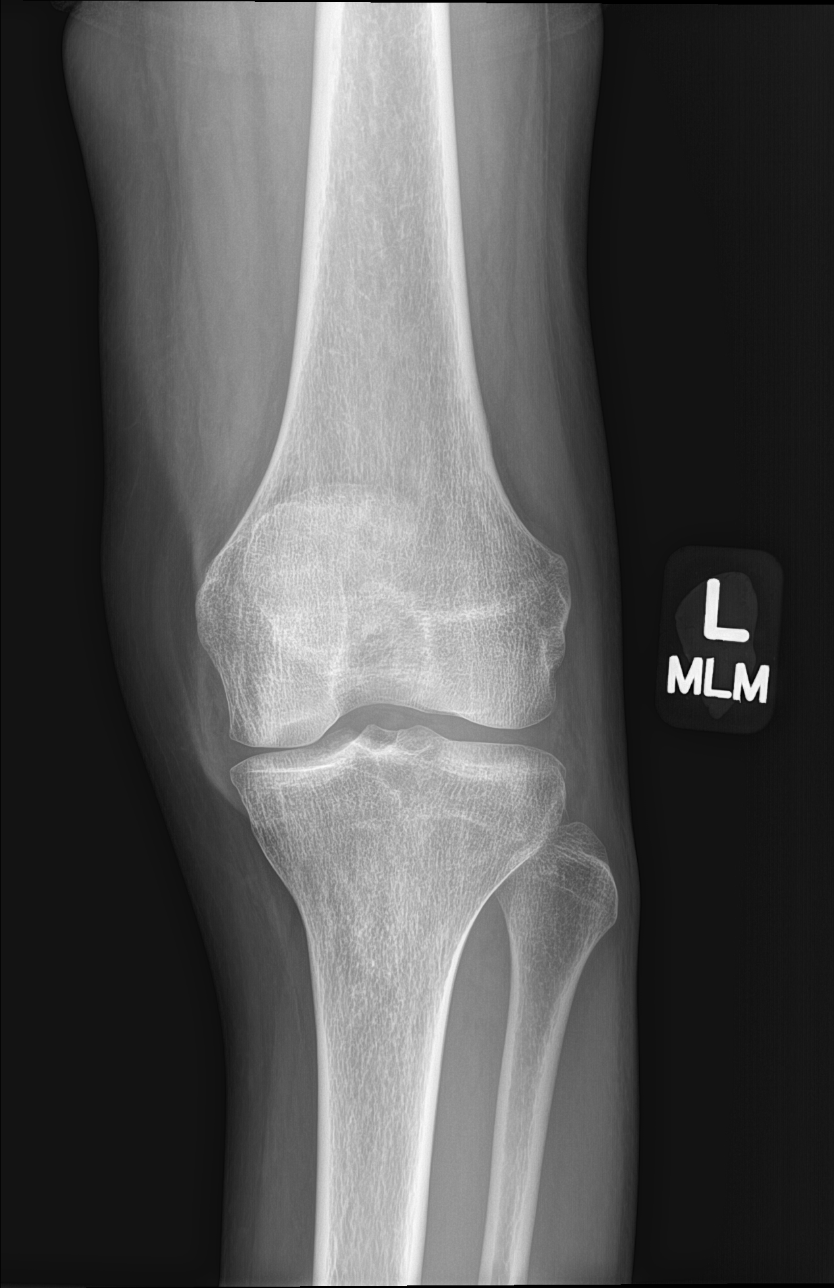

[knee lat]
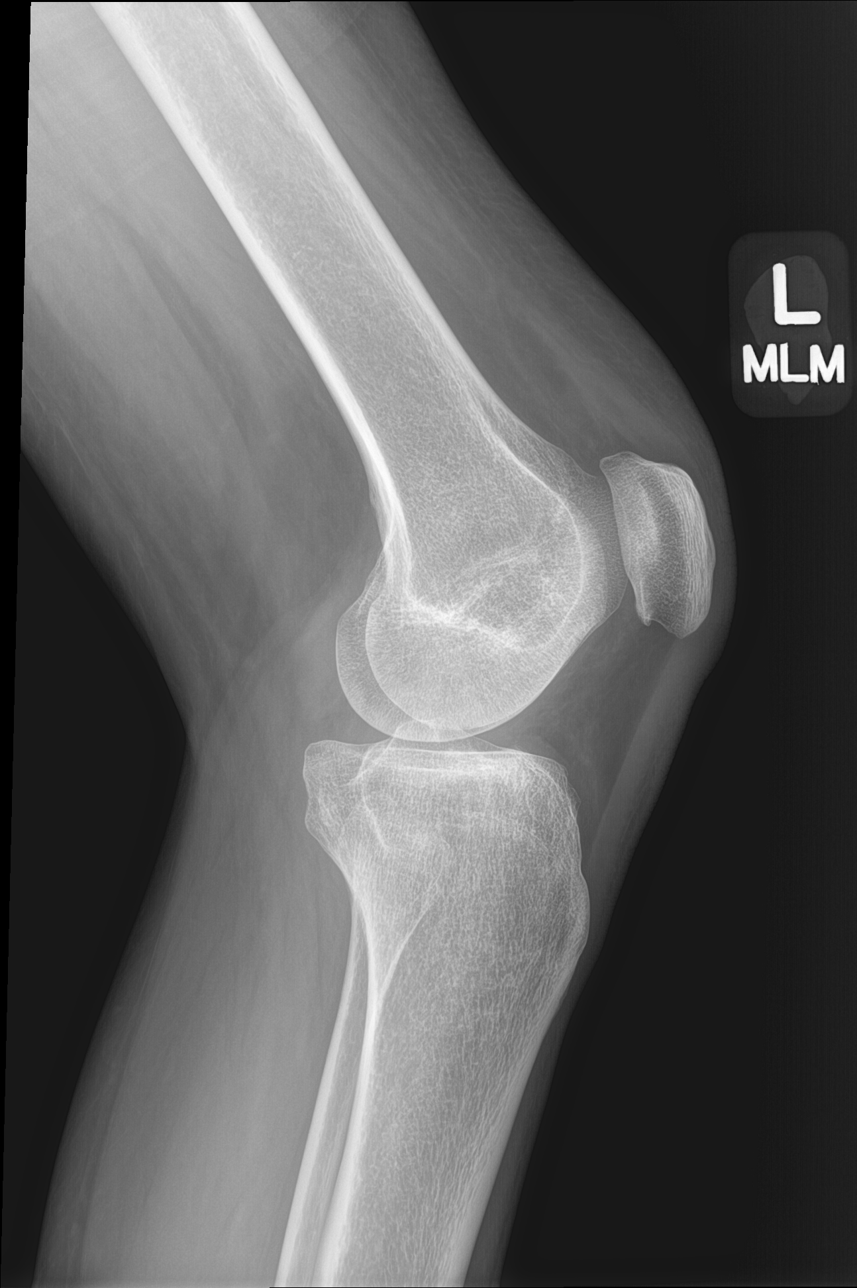

[patella skyline]
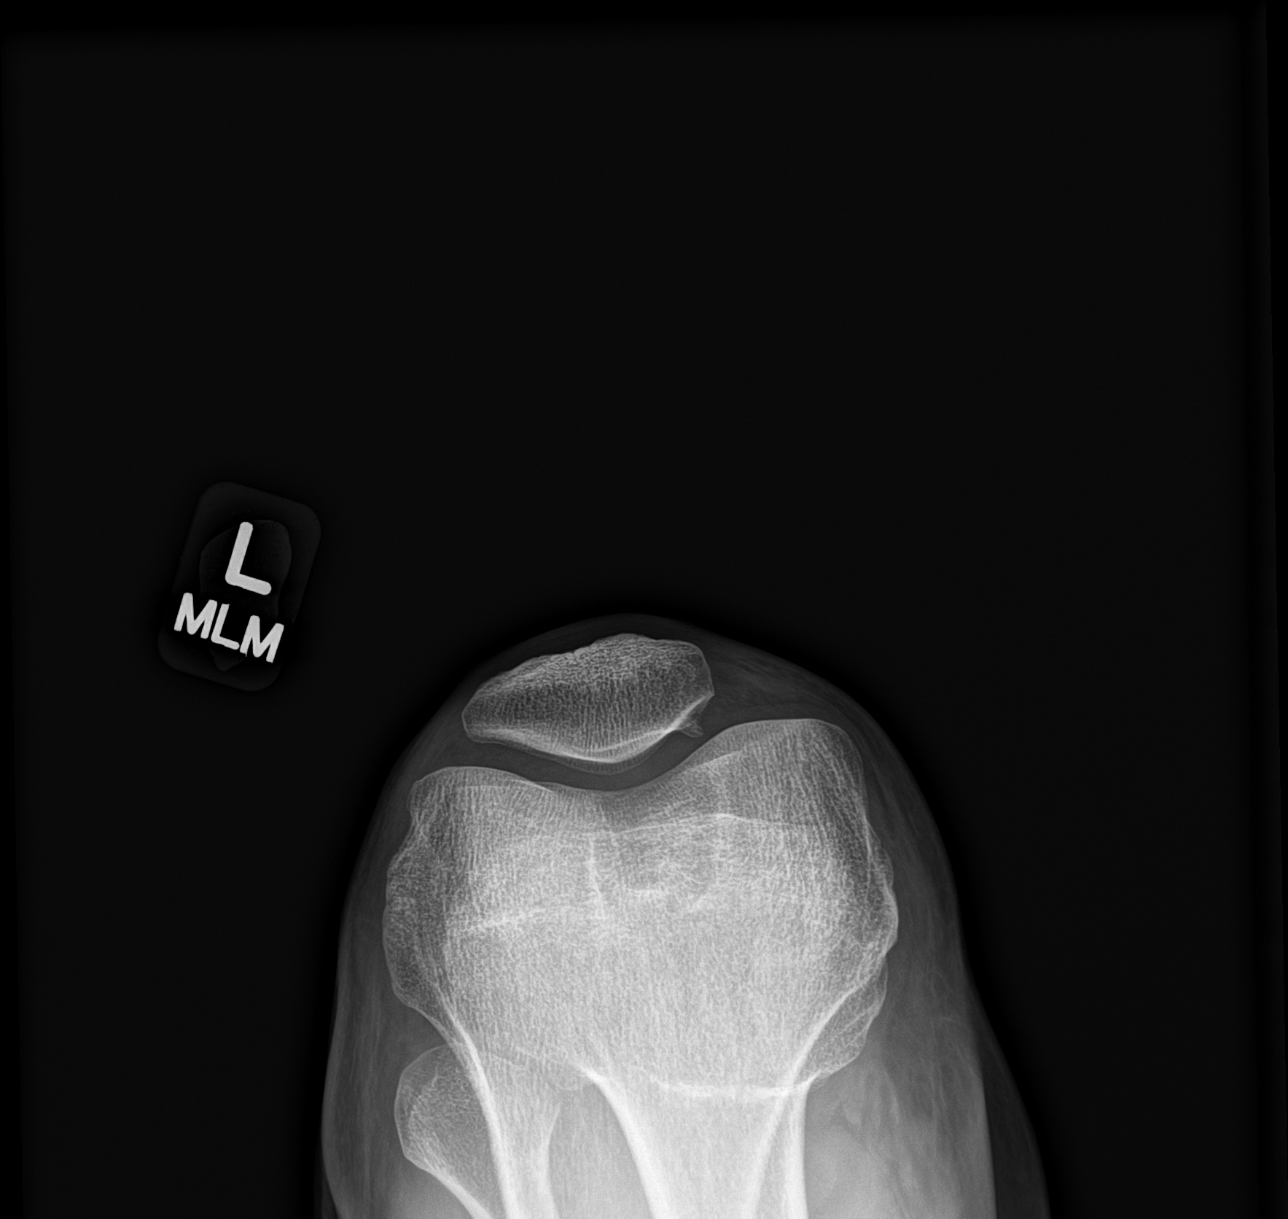

[knee tunnel]
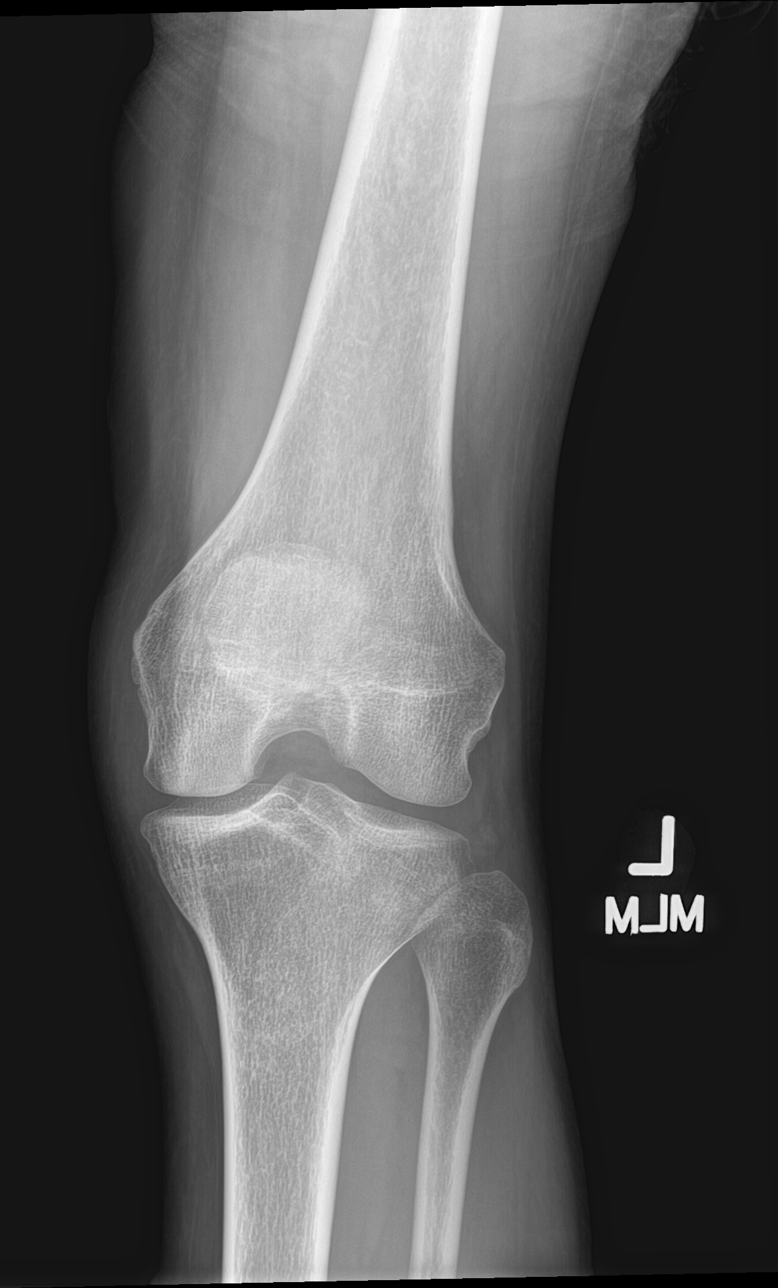

[4 of 4 positions shown; findings below may reference images not displayed]

FINDINGS: No fracture. No subluxation or dislocation. No joint effusion.
Hypertrophic spurring noted in the patellofemoral compartment.
IMPRESSION: Mild degenerative changes.  No acute bony abnormality.

## 2023-08-24 ENCOUNTER — Ambulatory Visit: Payer: Self-pay

## 2023-08-25 ENCOUNTER — Ambulatory Visit
Admission: RE | Admit: 2023-08-25 | Discharge: 2023-08-25 | Disposition: A | Discharge status: Home / Self Care | Source: Ambulatory Visit | Attending: Physician Assistant | Admitting: Physician Assistant

## 2023-08-25 ENCOUNTER — Ambulatory Visit
Admission: RE | Admit: 2023-08-25 | Discharge: 2023-08-25 | Disposition: A | Discharge status: Home / Self Care | Source: Ambulatory Visit

## 2023-08-25 ENCOUNTER — Encounter: Payer: Self-pay | Admitting: Emergency Medicine

## 2023-08-25 DIAGNOSIS — N898 Other specified noninflammatory disorders of vagina: Secondary | ICD-10-CM | POA: Diagnosis not present

## 2023-08-25 DIAGNOSIS — Z113 Encounter for screening for infections with a predominantly sexual mode of transmission: Secondary | ICD-10-CM | POA: Diagnosis not present

## 2023-08-25 DIAGNOSIS — R300 Dysuria: Secondary | ICD-10-CM | POA: Insufficient documentation

## 2023-08-25 DIAGNOSIS — Z114 Encounter for screening for human immunodeficiency virus [HIV]: Secondary | ICD-10-CM | POA: Diagnosis not present

## 2023-08-25 DIAGNOSIS — L292 Pruritus vulvae: Secondary | ICD-10-CM | POA: Insufficient documentation

## 2023-08-25 LAB — POCT URINALYSIS DIP (MANUAL ENTRY)
Bilirubin, UA: NEGATIVE
Blood, UA: NEGATIVE
Glucose, UA: NEGATIVE mg/dL
Ketones, POC UA: NEGATIVE mg/dL
Leukocytes, UA: NEGATIVE
Nitrite, UA: NEGATIVE
Protein Ur, POC: 30 mg/dL — AB
Spec Grav, UA: >=1.03 — AB (ref 1.010–1.025)
Urobilinogen, UA: 0.2 U/dL
pH, UA: 6 (ref 5.0–8.0)

## 2023-08-25 NOTE — ED Provider Notes (Signed)
 [facility]    CSN: [id number] Arrival date & time: 08/25/23  1825      History   Chief Complaint Chief Complaint  Patient presents with   Dysuria   Vaginal Discharge   Vaginal Itching    HPI Samantha Sutton is a 43 y.o. female.   Patient presents today for evaluation of dysuria, burning sensation lower abdomen, vaginal itching and discharge that started 5 days ago.  She would like STD screening if possible.  She denies any known exposures.  She has had some nausea but no vomiting.  She denies any back pain or fever.  The history is provided by the patient.    Past Medical History:  Diagnosis Date   Cerebral palsy [facility])     Patient Active Problem List   Diagnosis Date Noted   SVD (spontaneous vaginal delivery) 08/25/2023   Cerebral palsy (HCC) 05/10/2021   Chronic pain of both knees 05/10/2021   Undiagnosed cardiac murmurs 01/04/2013   Screening examination for sexually transmitted disease 08/01/2012   Infantile cerebral palsy (HCC) 11/13/2011    Past Surgical History:  Procedure Laterality Date   LEG SURGERY      OB History     Gravida  3   Para      Term      Preterm      AB      Living         SAB      IAB      Ectopic      Multiple      Live Births               Home Medications    Prior to Admission medications   Medication Sig Start Date End Date Taking? Authorizing Provider  gabapentin (NEURONTIN) 300 MG capsule 1 capsule. Patient not taking: Reported on 08/25/2023    [provider]  meloxicam (MOBIC) 15 MG tablet 1 tablet Orally Once a day; Duration: 30 day(s) Patient not taking: Reported on 08/25/2023    [provider]  methocarbamol  (ROBAXIN ) 500 MG tablet Take 1 tablet (500 mg total) by mouth every 8 (eight) hours as needed for muscle spasms. Patient not taking: Reported on 08/25/2023 10/12/22   Theadore Ozell HERO, MD  naproxen  (NAPROSYN ) 500 MG tablet Take 1 tablet (500 mg total) by mouth 2  (two) times daily. Patient not taking: Reported on 08/25/2023 10/12/22   Theadore Ozell HERO, MD    Family History Family History  Problem Relation Age of Onset   Diabetes Mother    High Cholesterol Mother    Hypertension Mother    Diabetes Maternal Grandmother    Hypertension Maternal Grandmother    High Cholesterol Maternal Grandmother    Diabetes Paternal Grandmother    High Cholesterol Paternal Grandmother     Social History Social History   Tobacco Use   Smoking status: Former   Smokeless tobacco: Never  Advertising account planner   Vaping status: Never Used  Substance Use Topics   Alcohol use: Yes   Drug use: Yes    Types: Marijuana     Allergies   Vicodin [hydrocodone-acetaminophen ] and Hydrocodone-acetaminophen    Review of Systems Review of Systems  Constitutional:  Negative for chills and fever.  Eyes:  Negative for discharge and redness.  Respiratory:  Negative for shortness of breath.   Gastrointestinal:  Positive for abdominal pain. Negative for nausea and vomiting.  Genitourinary:  Positive for dysuria and vaginal discharge. Negative for genital  sores.     Physical Exam Triage Vital Signs ED Triage Vitals  Encounter Vitals Group     BP      Girls Systolic BP Percentile      Girls Diastolic BP Percentile      Boys Systolic BP Percentile      Boys Diastolic BP Percentile      Pulse      Resp      Temp      Temp src      SpO2      Weight      Height      Head Circumference      Peak Flow      Pain Score      Pain Loc      Pain Education      Exclude from Growth Chart    No data found.  Updated Vital Signs BP 122/77 (BP Location: Left Arm)   Pulse 68   Temp 98.1 F (36.7 C) (Oral)   Resp 14   LMP 08/03/2023 (Exact Date)   SpO2 98%   Visual Acuity Right Eye Distance:   Left Eye Distance:   Bilateral Distance:    Right Eye Near:   Left Eye Near:    Bilateral Near:     Physical Exam Vitals and nursing note reviewed.  Constitutional:       General: She is not in acute distress.    Appearance: Normal appearance. She is not ill-appearing.  HENT:     Head: Normocephalic and atraumatic.   Eyes:     Conjunctiva/sclera: Conjunctivae normal.    Cardiovascular:     Rate and Rhythm: Normal rate.  Pulmonary:     Effort: Pulmonary effort is normal. No respiratory distress.   Neurological:     Mental Status: She is alert.   Psychiatric:        Mood and Affect: Mood normal.        Behavior: Behavior normal.        Thought Content: Thought content normal.      UC Treatments / Results  Labs (all labs ordered are listed, but only abnormal results are displayed) Labs Reviewed  POCT URINALYSIS DIP (MANUAL ENTRY) - Abnormal; Notable for the following components:      Result Value   Color, UA straw (*)    Clarity, UA cloudy (*)    Spec Grav, UA >=1.030 (*)    Protein Ur, POC =30 (*)    All other components within normal limits  RPR  HIV ANTIBODY (ROUTINE TESTING W REFLEX)  CERVICOVAGINAL ANCILLARY ONLY    EKG   Radiology No results found.  Procedures Procedures (including critical care time)  Medications Ordered in UC Medications - No data to display  Initial Impression / Assessment and Plan / UC Course  I have reviewed the triage vital signs and the nursing notes.  Pertinent labs & imaging results that were available during my care of the patient were reviewed by me and considered in my medical decision making (see chart for details).    UA without concerning findings for UTI.  Will order STD screening as requested.  Advised to abstain from sexual activity while awaiting results.  Encouraged follow-up with any further concerns.  Final Clinical Impressions(s) / UC Diagnoses   Final diagnoses:  Screening examination for sexually transmitted disease  Encounter for screening for HIV   Discharge Instructions   None    ED Prescriptions   None  PDMP not reviewed this encounter.   Billy Asberry FALCON,  PA-C 08/25/23 [phone]

## 2023-08-25 NOTE — ED Triage Notes (Signed)
 Pt reports dysuria, constant burning sensation in lower abdomen, vaginal itching, and thick chalky-yellow vaginal discharge x5 days. Would like to complete STI testing: swab & blood work.

## 2023-08-26 ENCOUNTER — Ambulatory Visit (HOSPITAL_COMMUNITY): Payer: Self-pay

## 2023-08-26 LAB — CERVICOVAGINAL ANCILLARY ONLY
Bacterial Vaginitis (gardnerella): POSITIVE — AB
Candida Glabrata: NEGATIVE
Candida Vaginitis: NEGATIVE
Chlamydia: NEGATIVE
Comment: NEGATIVE
Comment: NEGATIVE
Comment: NEGATIVE
Comment: NEGATIVE
Comment: NEGATIVE
Comment: NORMAL
Neisseria Gonorrhea: NEGATIVE
Trichomonas: NEGATIVE

## 2023-08-26 LAB — RPR: RPR Ser Ql: NONREACTIVE

## 2023-08-26 LAB — HIV ANTIBODY (ROUTINE TESTING W REFLEX): HIV Screen 4th Generation wRfx: NONREACTIVE

## 2023-08-26 MED ORDER — METRONIDAZOLE 500 MG PO TABS: 500.0000 mg | ORAL_TABLET | Freq: Two times a day (BID) | ORAL | 0 refills | Status: AC

## 2023-12-21 ENCOUNTER — Encounter (HOSPITAL_COMMUNITY): Payer: Self-pay

## 2023-12-21 ENCOUNTER — Ambulatory Visit (HOSPITAL_COMMUNITY)
Admission: EM | Admit: 2023-12-21 | Discharge: 2023-12-21 | Disposition: A | Discharge status: Home / Self Care | Attending: Family Medicine | Admitting: Family Medicine

## 2023-12-21 DIAGNOSIS — R21 Rash and other nonspecific skin eruption: Secondary | ICD-10-CM | POA: Diagnosis not present

## 2023-12-21 DIAGNOSIS — T7840XA Allergy, unspecified, initial encounter: Secondary | ICD-10-CM | POA: Diagnosis not present

## 2023-12-21 MED ORDER — CETIRIZINE HCL 10 MG PO TABS: 10.0000 mg | ORAL_TABLET | Freq: Every day | ORAL | 0 refills | Status: AC

## 2023-12-21 MED ORDER — PREDNISONE 20 MG PO TABS: 40.0000 mg | ORAL_TABLET | Freq: Every day | ORAL | 0 refills | Status: AC

## 2023-12-21 NOTE — ED Provider Notes (Signed)
 [facility]    CSN: [id number] Arrival date & time: 12/21/23  1128      History   Chief Complaint Chief Complaint  Patient presents with   Rash    HPI Samantha Sutton is a 43 y.o. female.   The history is provided by the patient. No language interpreter was used.  Rash Location: Left wrist lesion started last night. Quality: blistering, burning, draining, itchiness, painful, redness, swelling and weeping   Quality comment:  Pain is about 7/10 in severity. Described as a burning pain with deep itch. This started swelling up this morning. Severity:  Moderate Onset quality:  Gradual Duration: Started last night, less than 24 hrs ago. Progression:  Worsening Chronicity:  New Context: not animal contact, not chemical exposure, not exposure to similar rash, not insect bite/sting, not medications, not plant contact and not sick contacts   Context comment:  She was at work when this started. Denies any known contact Relieved by:  Nothing Worsened by:  Nothing Ineffective treatments: Neosporin and OTC itching meds. Associated symptoms: no fever, no headaches, no nausea, no shortness of breath, no sore throat, not vomiting and not wheezing   LMP: 12/16/23  Past Medical History:  Diagnosis Date   Cerebral palsy [facility])     Patient Active Problem List   Diagnosis Date Noted   SVD (spontaneous vaginal delivery) 08/25/2023   Cerebral palsy (HCC) 05/10/2021   Chronic pain of both knees 05/10/2021   Undiagnosed cardiac murmurs 01/04/2013   Screening examination for sexually transmitted disease 08/01/2012   Infantile cerebral palsy (HCC) 11/13/2011    Past Surgical History:  Procedure Laterality Date   LEG SURGERY      OB History     Gravida  3   Para      Term      Preterm      AB      Living         SAB      IAB      Ectopic      Multiple      Live Births               Home Medications    Prior to Admission medications    Medication Sig Start Date End Date Taking? Authorizing Provider  cetirizine (ZYRTEC ALLERGY) 10 MG tablet Take 1 tablet (10 mg total) by mouth daily. 12/21/23  Yes Anders Otto DASEN, MD  predniSONE  (DELTASONE ) 20 MG tablet Take 2 tablets (40 mg total) by mouth daily for 7 days. 12/21/23 12/28/23 Yes Anders Otto DASEN, MD  gabapentin (NEURONTIN) 300 MG capsule 1 capsule. Patient not taking: Reported on 08/25/2023    [provider]  meloxicam (MOBIC) 15 MG tablet 1 tablet Orally Once a day; Duration: 30 day(s) Patient not taking: Reported on 08/25/2023    [provider]  methocarbamol  (ROBAXIN ) 500 MG tablet Take 1 tablet (500 mg total) by mouth every 8 (eight) hours as needed for muscle spasms. Patient not taking: Reported on 08/25/2023 10/12/22   Theadore Ozell HERO, MD  naproxen  (NAPROSYN ) 500 MG tablet Take 1 tablet (500 mg total) by mouth 2 (two) times daily. Patient not taking: Reported on 08/25/2023 10/12/22   Theadore Ozell HERO, MD    Family History Family History  Problem Relation Age of Onset   Diabetes Mother    High Cholesterol Mother    Hypertension Mother    Diabetes Maternal Grandmother    Hypertension Maternal Grandmother  High Cholesterol Maternal Grandmother    Diabetes Paternal Grandmother    High Cholesterol Paternal Grandmother     Social History Social History   Tobacco Use   Smoking status: Former   Smokeless tobacco: Never  [employer]   Vaping status: Never Used  Substance Use Topics   Alcohol use: Yes   Drug use: Yes    Types: Marijuana     Allergies   Vicodin [hydrocodone-acetaminophen ] and Hydrocodone-acetaminophen    Review of Systems Review of Systems  Constitutional:  Negative for fever.  HENT:  Negative for sore throat.   Respiratory:  Negative for shortness of breath and wheezing.   Gastrointestinal:  Negative for nausea and vomiting.  Skin:  Positive for rash.  Neurological:  Negative for headaches.     Physical  Exam Triage Vital Signs ED Triage Vitals  Encounter Vitals Group     BP      Girls Systolic BP Percentile      Girls Diastolic BP Percentile      Boys Systolic BP Percentile      Boys Diastolic BP Percentile      Pulse      Resp      Temp      Temp src      SpO2      Weight      Height      Head Circumference      Peak Flow      Pain Score      Pain Loc      Pain Education      Exclude from Growth Chart    No data found.  Updated Vital Signs BP 127/70 (BP Location: Left Arm)   Pulse 73   Temp 97.6 F (36.4 C) (Oral)   Resp 16   LMP 12/16/2023 (Approximate)   SpO2 98%   Visual Acuity Right Eye Distance:   Left Eye Distance:   Bilateral Distance:    Right Eye Near:   Left Eye Near:    Bilateral Near:     Physical Exam Vitals and nursing note reviewed.  Constitutional:      General: She is not in acute distress.    Appearance: She is not ill-appearing.  Cardiovascular:     Rate and Rhythm: Normal rate and regular rhythm.     Heart sounds: Normal heart sounds. No murmur heard. Pulmonary:     Effort: Pulmonary effort is normal. No respiratory distress.     Breath sounds: Normal breath sounds. No stridor. No wheezing or rhonchi.  Skin:    Comments: Moderately swollen volar surface of her left forearm with very mild erythema and multiple papulopustular lesions. No warth or intense erythema to suggest infection of the skin.       UC Treatments / Results  Labs (all labs ordered are listed, but only abnormal results are displayed) Labs Reviewed - No data to display  EKG   Radiology No results found.  Procedures Procedures (including critical care time)  Medications Ordered in UC Medications - No data to display  Initial Impression / Assessment and Plan / UC Course  I have reviewed the triage vital signs and the nursing notes.  Pertinent labs & imaging results that were available during my care of the patient were reviewed by me and considered in  my medical decision making (see chart for details).  Clinical Course as of 12/21/23 1236  Sun Dec 21, 2023  1234 Skin reaction. I suspect this is due to  an allergic reaction from exposure to an unknown agent. No signs of infection. We will treat you with oral prednisone  and Zyrtec. Use Benadryl as needed for itching. If symptoms worsen or she develops severe redness and warmth over the lesion,to return to the UC sooner than later Please see your PCP soon for reassessment.  [KE]    Clinical Course User Index [KE] Anders Otto DASEN, MD     Final Clinical Impressions(s) / UC Diagnoses   Final diagnoses:  Rash and nonspecific skin eruption  Allergic reaction, initial encounter     Discharge Instructions       Nice meeting you, Ms. Proudfoot. I am sorry about your rash. I suspect this is due to an allergic reaction from exposure to an unknown agent. We will treat you with oral prednisone  and Zyrtec. Use Benadryl as needed for itching. If symptoms worsen or you develop severe redness and warmth over the lesion, please see us  sooner.  Please see your PCP soon for reassessment.      ED Prescriptions     Medication Sig Dispense Auth. Provider   predniSONE  (DELTASONE ) 20 MG tablet Take 2 tablets (40 mg total) by mouth daily for 7 days. 14 tablet Anders Otto T, MD   cetirizine (ZYRTEC ALLERGY) 10 MG tablet Take 1 tablet (10 mg total) by mouth daily. 30 tablet Anders Otto DASEN, MD      PDMP not reviewed this encounter.   Anders Otto DASEN, MD 12/21/23 [phone]

## 2023-12-21 NOTE — ED Triage Notes (Signed)
 The patient noticed Pustules to her left wrist that are itchy. The area is inflamed and swelling to wrist is noted. The patient does not recall changing anything in care to cause the pustules.   Started: last night around 0400  Home interventions: neosporin

## 2023-12-21 NOTE — Discharge Instructions (Signed)
  Nice meeting you, Samantha Sutton. I am sorry about your rash. I suspect this is due to an allergic reaction from exposure to an unknown agent. We will treat you with oral prednisone  and Zyrtec. Use Benadryl as needed for itching. If symptoms worsen or you develop severe redness and warmth over the lesion, please see us  sooner.  Please see your PCP soon for reassessment.
